# Patient Record
Sex: Female | Born: 1960 | Hispanic: No | Marital: Married | Smoking: Former smoker
Health system: Southern US, Community
[De-identification: ages and names within clinical notes are randomized; demographics above are authoritative.]

## PROBLEM LIST (undated history)

## (undated) DIAGNOSIS — G5730 Lesion of lateral popliteal nerve, unspecified lower limb: Secondary | ICD-10-CM

## (undated) DIAGNOSIS — M5030 Other cervical disc degeneration, unspecified cervical region: Principal | ICD-10-CM

## (undated) DIAGNOSIS — E559 Vitamin D deficiency, unspecified: Secondary | ICD-10-CM

## (undated) DIAGNOSIS — M1990 Unspecified osteoarthritis, unspecified site: Secondary | ICD-10-CM

## (undated) DIAGNOSIS — E890 Postprocedural hypothyroidism: Secondary | ICD-10-CM

## (undated) DIAGNOSIS — M5136 Other intervertebral disc degeneration, lumbar region: Secondary | ICD-10-CM

## (undated) DIAGNOSIS — M4800 Spinal stenosis, site unspecified: Secondary | ICD-10-CM

## (undated) DIAGNOSIS — M419 Scoliosis, unspecified: Secondary | ICD-10-CM

## (undated) DIAGNOSIS — M549 Dorsalgia, unspecified: Secondary | ICD-10-CM

## (undated) HISTORY — DX: Other cervical disc degeneration, unspecified cervical region: M50.30

## (undated) HISTORY — DX: Lesion of lateral popliteal nerve, unspecified lower limb: G57.30

## (undated) HISTORY — PX: CARPAL TUNNEL RELEASE: SHX101

## (undated) HISTORY — PX: THYROIDECTOMY: SHX17

## (undated) HISTORY — PX: LUMBAR DISC SURGERY: SHX700

## (undated) HISTORY — PX: BACK SURGERY: SHX140

## (undated) HISTORY — DX: Vitamin D deficiency, unspecified: E55.9

## (undated) HISTORY — PX: CATARACT EXTRACTION, BILATERAL: SHX1313

## (undated) HISTORY — DX: Postprocedural hypothyroidism: E89.0

## (undated) HISTORY — DX: Other intervertebral disc degeneration, lumbar region: M51.36

---

## 2015-01-29 ENCOUNTER — Encounter (HOSPITAL_COMMUNITY): Payer: Self-pay | Admitting: *Deleted

## 2015-01-29 ENCOUNTER — Emergency Department (INDEPENDENT_AMBULATORY_CARE_PROVIDER_SITE_OTHER)
Admission: EM | Admit: 2015-01-29 | Discharge: 2015-01-29 | Disposition: A | Discharge status: Home / Self Care | Payer: Medicaid Other | Source: Home / Self Care

## 2015-01-29 DIAGNOSIS — M5432 Sciatica, left side: Secondary | ICD-10-CM

## 2015-01-29 DIAGNOSIS — M5126 Other intervertebral disc displacement, lumbar region: Secondary | ICD-10-CM | POA: Diagnosis not present

## 2015-01-29 DIAGNOSIS — G629 Polyneuropathy, unspecified: Secondary | ICD-10-CM

## 2015-01-29 HISTORY — DX: Spinal stenosis, site unspecified: M48.00

## 2015-01-29 HISTORY — DX: Dorsalgia, unspecified: M54.9

## 2015-01-29 HISTORY — DX: Scoliosis, unspecified: M41.9

## 2015-01-29 MED ORDER — METHOCARBAMOL 500 MG PO TABS: 500.0000 mg | ORAL_TABLET | Freq: Four times a day (QID) | ORAL | Status: DC | PRN

## 2015-01-29 MED ORDER — DICLOFENAC SODIUM 75 MG PO TBEC: 75.0000 mg | DELAYED_RELEASE_TABLET | Freq: Two times a day (BID) | ORAL | Status: DC

## 2015-01-29 MED ORDER — PREDNISONE 10 MG PO KIT: PACK | ORAL | Status: DC

## 2015-01-29 NOTE — Discharge Instructions (Signed)
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  Sciatica with Rehab The sciatic nerve runs from the back down the leg and is responsible for sensation and control of the muscles in the back (posterior) side of the thigh, lower leg, and foot. Sciatica is a condition that is characterized by inflammation of this nerve.  SYMPTOMS   Signs of nerve damage, including numbness and/or weakness along the posterior side of the lower extremity.  Pain in the back of the thigh that may also travel down the leg.  Pain that worsens when sitting for long periods of time.  Occasionally, pain in the back or buttock. CAUSES  Inflammation of the sciatic nerve is the cause of sciatica. The inflammation is due to something irritating the nerve. Common sources of irritation include:  Sitting for long periods of time.  Direct trauma to the nerve.  Arthritis of the spine.  Herniated or ruptured disk.  Slipping of the vertebrae (spondylolisthesis).  Pressure from soft tissues, such as muscles or ligament-like tissue (fascia). RISK INCREASES WITH:  Sports that place pressure or stress on the spine (football or weightlifting).  Poor strength and flexibility.  Failure to warm up properly before activity.  Family history of low back pain or disk disorders.  Previous back injury or surgery.  Poor body mechanics, especially when lifting, or poor posture. PREVENTION   Warm up and stretch properly before activity.  Maintain physical fitness:  Strength, flexibility, and endurance.  Cardiovascular fitness.  Learn and use proper technique, especially with posture and lifting. When possible, have coach correct improper  technique.  Avoid activities that place stress on the spine. PROGNOSIS If treated properly, then sciatica usually resolves within 6 weeks. However, occasionally surgery is necessary.  RELATED COMPLICATIONS   Permanent nerve damage, including pain, numbness, tingle, or weakness.  Chronic back pain.  Risks of surgery: infection, bleeding, nerve damage, or damage to surrounding tissues. TREATMENT Treatment initially involves resting from any activities that aggravate your symptoms. The use of ice and medication may help reduce pain and inflammation. The use of strengthening and stretching exercises may help reduce pain with activity. These exercises may be performed at home or with referral to a therapist. A therapist may recommend further treatments, such as transcutaneous electronic nerve stimulation (TENS) or ultrasound. Your caregiver may recommend corticosteroid injections to help reduce inflammation of the sciatic nerve. If symptoms persist despite non-surgical (conservative) treatment, then surgery may be recommended. MEDICATION  If pain medication is necessary, then nonsteroidal anti-inflammatory medications, such as aspirin and ibuprofen, or other minor pain relievers, such as acetaminophen, are often recommended.  Do not take pain medication for 7 days before surgery.  Prescription pain relievers may be given if deemed necessary by your caregiver. Use only as directed and only as much as you need.  Ointments applied to the skin may be helpful.  Corticosteroid injections may be given by your caregiver. These injections should be reserved for the most serious cases, because they may only be given a certain number of times. HEAT AND COLD  Cold treatment (icing) relieves pain and reduces inflammation. Cold treatment should be applied for 10 to 15 minutes every 2 to 3 hours for inflammation and pain and immediately after any  activity that aggravates your symptoms. Use ice packs or  massage the area with a piece of ice (ice massage).  Heat treatment may be used prior to performing the stretching and strengthening activities prescribed by your caregiver, physical therapist, or athletic trainer. Use a heat pack or soak the injury in warm water. SEEK MEDICAL CARE IF:  Treatment seems to offer no benefit, or the condition worsens.  Any medications produce adverse side effects. EXERCISES  RANGE OF MOTION (ROM) AND STRETCHING EXERCISES - Sciatica Most people with sciatic will find that their symptoms worsen with either excessive bending forward (flexion) or arching at the low back (extension). The exercises which will help resolve your symptoms will focus on the opposite motion. Your physician, physical therapist or athletic trainer will help you determine which exercises will be most helpful to resolve your low back pain. Do not complete any exercises without first consulting with your clinician. Discontinue any exercises which worsen your symptoms until you speak to your clinician. If you have pain, numbness or tingling which travels down into your buttocks, leg or foot, the goal of the therapy is for these symptoms to move closer to your back and eventually resolve. Occasionally, these leg symptoms will get better, but your low back pain may worsen; this is typically an indication of progress in your rehabilitation. Be certain to be very alert to any changes in your symptoms and the activities in which you participated in the 24 hours prior to the change. Sharing this information with your clinician will allow him/her to most efficiently treat your condition. These exercises may help you when beginning to rehabilitate your injury. Your symptoms may resolve with or without further involvement from your physician, physical therapist or athletic trainer. While completing these exercises, remember:   Restoring tissue flexibility helps normal motion to return to the joints. This allows  healthier, less painful movement and activity.  An effective stretch should be held for at least 30 seconds.  A stretch should never be painful. You should only feel a gentle lengthening or release in the stretched tissue. FLEXION RANGE OF MOTION AND STRETCHING EXERCISES: STRETCH - Flexion, Single Knee to Chest   Lie on a firm bed or floor with both legs extended in front of you.  Keeping one leg in contact with the floor, bring your opposite knee to your chest. Hold your leg in place by either grabbing behind your thigh or at your knee.  Pull until you feel a gentle stretch in your low back. Hold __________ seconds.  Slowly release your grasp and repeat the exercise with the opposite side. Repeat __________ times. Complete this exercise __________ times per day.  STRETCH - Flexion, Double Knee to Chest  Lie on a firm bed or floor with both legs extended in front of you.  Keeping one leg in contact with the floor, bring your opposite knee to your chest.  Tense your stomach muscles to support your back and then lift your other knee to your chest. Hold your legs in place by either grabbing behind your thighs or at your knees.  Pull both knees toward your chest until you feel a gentle stretch in your low back. Hold __________ seconds.  Tense your stomach muscles and slowly return one leg at a time to the floor. Repeat __________ times. Complete this exercise __________ times per day.  STRETCH - Low Trunk Rotation   Lie on a firm bed or floor. Keeping your legs in front of you, bend  your knees so they are both pointed toward the ceiling and your feet are flat on the floor.  Extend your arms out to the side. This will stabilize your upper body by keeping your shoulders in contact with the floor.  Gently and slowly drop both knees together to one side until you feel a gentle stretch in your low back. Hold for __________ seconds.  Tense your stomach muscles to support your low back as  you bring your knees back to the starting position. Repeat the exercise to the other side. Repeat __________ times. Complete this exercise __________ times per day  EXTENSION RANGE OF MOTION AND FLEXIBILITY EXERCISES: STRETCH - Extension, Prone on Elbows  Lie on your stomach on the floor, a bed will be too soft. Place your palms about shoulder width apart and at the height of your head.  Place your elbows under your shoulders. If this is too painful, stack pillows under your chest.  Allow your body to relax so that your hips drop lower and make contact more completely with the floor.  Hold this position for __________ seconds.  Slowly return to lying flat on the floor. Repeat __________ times. Complete this exercise __________ times per day.  RANGE OF MOTION - Extension, Prone Press Ups  Lie on your stomach on the floor, a bed will be too soft. Place your palms about shoulder width apart and at the height of your head.  Keeping your back as relaxed as possible, slowly straighten your elbows while keeping your hips on the floor. You may adjust the placement of your hands to maximize your comfort. As you gain motion, your hands will come more underneath your shoulders.  Hold this position __________ seconds.  Slowly return to lying flat on the floor. Repeat __________ times. Complete this exercise __________ times per day.  STRENGTHENING EXERCISES - Sciatica  These exercises may help you when beginning to rehabilitate your injury. These exercises should be done near your "sweet spot." This is the neutral, low-back arch, somewhere between fully rounded and fully arched, that is your least painful position. When performed in this safe range of motion, these exercises can be used for people who have either a flexion or extension based injury. These exercises may resolve your symptoms with or without further involvement from your physician, physical therapist or athletic trainer. While completing  these exercises, remember:   Muscles can gain both the endurance and the strength needed for everyday activities through controlled exercises.  Complete these exercises as instructed by your physician, physical therapist or athletic trainer. Progress with the resistance and repetition exercises only as your caregiver advises.  You may experience muscle soreness or fatigue, but the pain or discomfort you are trying to eliminate should never worsen during these exercises. If this pain does worsen, stop and make certain you are following the directions exactly. If the pain is still present after adjustments, discontinue the exercise until you can discuss the trouble with your clinician. STRENGTHENING - Deep Abdominals, Pelvic Tilt   Lie on a firm bed or floor. Keeping your legs in front of you, bend your knees so they are both pointed toward the ceiling and your feet are flat on the floor.  Tense your lower abdominal muscles to press your low back into the floor. This motion will rotate your pelvis so that your tail bone is scooping upwards rather than pointing at your feet or into the floor.  With a gentle tension and even breathing, hold this  position for __________ seconds. Repeat __________ times. Complete this exercise __________ times per day.  STRENGTHENING - Abdominals, Crunches   Lie on a firm bed or floor. Keeping your legs in front of you, bend your knees so they are both pointed toward the ceiling and your feet are flat on the floor. Cross your arms over your chest.  Slightly tip your chin down without bending your neck.  Tense your abdominals and slowly lift your trunk high enough to just clear your shoulder blades. Lifting higher can put excessive stress on the low back and does not further strengthen your abdominal muscles.  Control your return to the starting position. Repeat __________ times. Complete this exercise __________ times per day.  STRENGTHENING - Quadruped, Opposite  UE/LE Lift  Assume a hands and knees position on a firm surface. Keep your hands under your shoulders and your knees under your hips. You may place padding under your knees for comfort.  Find your neutral spine and gently tense your abdominal muscles so that you can maintain this position. Your shoulders and hips should form a rectangle that is parallel with the floor and is not twisted.  Keeping your trunk steady, lift your right hand no higher than your shoulder and then your left leg no higher than your hip. Make sure you are not holding your breath. Hold this position __________ seconds.  Continuing to keep your abdominal muscles tense and your back steady, slowly return to your starting position. Repeat with the opposite arm and leg. Repeat __________ times. Complete this exercise __________ times per day.  STRENGTHENING - Abdominals and Quadriceps, Straight Leg Raise   Lie on a firm bed or floor with both legs extended in front of you.  Keeping one leg in contact with the floor, bend the other knee so that your foot can rest flat on the floor.  Find your neutral spine, and tense your abdominal muscles to maintain your spinal position throughout the exercise.  Slowly lift your straight leg off the floor about 6 inches for a count of 15, making sure to not hold your breath.  Still keeping your neutral spine, slowly lower your leg all the way to the floor. Repeat this exercise with each leg __________ times. Complete this exercise __________ times per day. POSTURE AND BODY MECHANICS CONSIDERATIONS - Sciatica Keeping correct posture when sitting, standing or completing your activities will reduce the stress put on different body tissues, allowing injured tissues a chance to heal and limiting painful experiences. The following are general guidelines for improved posture. Your physician or physical therapist will provide you with any instructions specific to your needs. While reading these  guidelines, remember:  The exercises prescribed by your provider will help you have the flexibility and strength to maintain correct postures.  The correct posture provides the optimal environment for your joints to work. All of your joints have less wear and tear when properly supported by a spine with good posture. This means you will experience a healthier, less painful body.  Correct posture must be practiced with all of your activities, especially prolonged sitting and standing. Correct posture is as important when doing repetitive low-stress activities (typing) as it is when doing a single heavy-load activity (lifting). RESTING POSITIONS Consider which positions are most painful for you when choosing a resting position. If you have pain with flexion-based activities (sitting, bending, stooping, squatting), choose a position that allows you to rest in a less flexed posture. You would want to avoid  curling into a fetal position on your side. If your pain worsens with extension-based activities (prolonged standing, working overhead), avoid resting in an extended position such as sleeping on your stomach. Most people will find more comfort when they rest with their spine in a more neutral position, neither too rounded nor too arched. Lying on a non-sagging bed on your side with a pillow between your knees, or on your back with a pillow under your knees will often provide some relief. Keep in mind, being in any one position for a prolonged period of time, no matter how correct your posture, can still lead to stiffness. PROPER SITTING POSTURE In order to minimize stress and discomfort on your spine, you must sit with correct posture Sitting with good posture should be effortless for a healthy body. Returning to good posture is a gradual process. Many people can work toward this most comfortably by using various supports until they have the flexibility and strength to maintain this posture on their  own. When sitting with proper posture, your ears will fall over your shoulders and your shoulders will fall over your hips. You should use the back of the chair to support your upper back. Your low back will be in a neutral position, just slightly arched. You may place a small pillow or folded towel at the base of your low back for support.  When working at a desk, create an environment that supports good, upright posture. Without extra support, muscles fatigue and lead to excessive strain on joints and other tissues. Keep these recommendations in mind: CHAIR:   A chair should be able to slide under your desk when your back makes contact with the back of the chair. This allows you to work closely.  The chair's height should allow your eyes to be level with the upper part of your monitor and your hands to be slightly lower than your elbows. BODY POSITION  Your feet should make contact with the floor. If this is not possible, use a foot rest.  Keep your ears over your shoulders. This will reduce stress on your neck and low back. INCORRECT SITTING POSTURES   If you are feeling tired and unable to assume a healthy sitting posture, do not slouch or slump. This puts excessive strain on your back tissues, causing more damage and pain. Healthier options include:  Using more support, like a lumbar pillow.  Switching tasks to something that requires you to be upright or walking.  Talking a brief walk.  Lying down to rest in a neutral-spine position. PROLONGED STANDING WHILE SLIGHTLY LEANING FORWARD  When completing a task that requires you to lean forward while standing in one place for a long time, place either foot up on a stationary 2-4 inch high object to help maintain the best posture. When both feet are on the ground, the low back tends to lose its slight inward curve. If this curve flattens (or becomes too large), then the back and your other joints will experience too much stress, fatigue more  quickly and can cause pain.  CORRECT STANDING POSTURES Proper standing posture should be assumed with all daily activities, even if they only take a few moments, like when brushing your teeth. As in sitting, your ears should fall over your shoulders and your shoulders should fall over your hips. You should keep a slight tension in your abdominal muscles to brace your spine. Your tailbone should point down to the ground, not behind your body, resulting in  an over-extended swayback posture.  INCORRECT STANDING POSTURES  Common incorrect standing postures include a forward head, locked knees and/or an excessive swayback. WALKING Walk with an upright posture. Your ears, shoulders and hips should all line-up. PROLONGED ACTIVITY IN A FLEXED POSITION When completing a task that requires you to bend forward at your waist or lean over a low surface, try to find a way to stabilize 3 of 4 of your limbs. You can place a hand or elbow on your thigh or rest a knee on the surface you are reaching across. This will provide you more stability so that your muscles do not fatigue as quickly. By keeping your knees relaxed, or slightly bent, you will also reduce stress across your low back. CORRECT LIFTING TECHNIQUES DO :   Assume a wide stance. This will provide you more stability and the opportunity to get as close as possible to the object which you are lifting.  Tense your abdominals to brace your spine; then bend at the knees and hips. Keeping your back locked in a neutral-spine position, lift using your leg muscles. Lift with your legs, keeping your back straight.  Test the weight of unknown objects before attempting to lift them.  Try to keep your elbows locked down at your sides in order get the best strength from your shoulders when carrying an object.  Always ask for help when lifting heavy or awkward objects. INCORRECT LIFTING TECHNIQUES DO NOT:   Lock your knees when lifting, even if it is a small  object.  Bend and twist. Pivot at your feet or move your feet when needing to change directions.  Assume that you cannot safely pick up a paperclip without proper posture. Document Released: 11/02/2005 Document Revised: 03/19/2014 Document Reviewed: 02/14/2009 [facility] Patient Information 2015 [place], [place]. This information is not intended to replace advice given to you by your health care provider. Make sure you discuss any questions you have with your health care provider.

## 2015-01-29 NOTE — ED Notes (Signed)
Pt is arabic speaking, here with pt and interpreter. Pt complains of back pain with left left limited ROM and numbness. Pt [place]. Pt has only been in [place] X 1 week. Pt is also complaining of left periorbital pressure.

## 2015-01-29 NOTE — ED Provider Notes (Signed)
CSN: [id number]     Arrival date & time 01/29/15  1006 History   None    Chief Complaint  Patient presents with  . Back Pain  . Leg Pain    left  . Numbness    left foot   (Consider location/radiation/quality/duration/timing/severity/associated sxs/prior Treatment) HPI   Assisted in pt encoutner by arabic interpreter.   Patient is a Ashley Bates who arrived from [place] to the [place] one week ago. Patient reports chronic ongoing back pain for several years and is status post 3 surgeries (2007,2008, 2009). Current episode started approximately 2-3 weeks ago. Reports her significant cortisone injection in the back in [place] 2 weeks ago. This provided some temporary relief of the pain but patient noted new onset numbness after the injection. Back pain radiates from the left lower back down the left thigh. Numbness extends down to the left foot and endorses some weakness. Patient is able to ambulate. Nothing makes his symptoms better. Patient is not taken anything for her symptoms. Symptoms are progressive. Denies dizziness, lightheadedness, headache, falls, chest pain, palpitations, seizure-like activity, history of diabetes, rash, fever  Past Medical History  Diagnosis Date  . Back pain   . Scoliosis   . Spinal stenosis    Past Surgical History  Procedure Laterality Date  . Back surgery      x 3  . Lumbar disc surgery     History reviewed. No pertinent family history. History  Substance Use Topics  . Smoking status: Never Smoker   . Smokeless tobacco: Not on file  . Alcohol Use: No   OB History    No data available     Review of Systems  Allergies  Review of patient's allergies indicates no known allergies.  Home Medications   Prior to Admission medications   Medication Sig Start Date End Date Taking? Authorizing Provider  diclofenac (VOLTAREN) 75 MG EC tablet Take 1 tablet (75 mg total) by mouth 2 (two) times daily. 01/29/15   Waldemar Dickens, MD  methocarbamol  (ROBAXIN) 500 MG tablet Take 1-2 tablets (500-1,000 mg total) by mouth every 6 (six) hours as needed for muscle spasms. 01/29/15   Waldemar Dickens, MD  PredniSONE 10 MG KIT 12 day dose pack 01/29/15   Waldemar Dickens, MD   BP 110/68 mmHg  Pulse 119  Temp(Src) 98.7 F (37.1 C) (Oral)  Resp 16  SpO2 96% Physical Exam  Constitutional: She is oriented to person, place, and time. She appears well-developed and well-nourished.  HENT:  Head: Normocephalic and atraumatic.  Eyes: EOM are normal. Pupils are equal, round, and reactive to light.  Neck: Normal range of motion.  Cardiovascular: Normal rate.   Pulmonary/Chest: Effort normal and breath sounds normal.  Abdominal: Soft. Bowel sounds are normal.  Musculoskeletal: Normal range of motion.  L Hip flexion 5/5 and R 5/5. Leg extension L 4/5 and % 5/5.  Abduction and adduction 5/5 bilat  Neurological: She is alert and oriented to person, place, and time.  Skin: Skin is warm.  Psychiatric: She has a normal mood and affect. Her behavior is normal. Judgment and thought content normal.    ED Course  Procedures (including critical care time) Labs Review Labs Reviewed - No data to display  Imaging Review No results found.   MDM   1. Sciatica, left   2. Lumbar herniated disc   3. Neuropathy    Patient with complex past medical history of significant vertebral and spinal pathology. MRI  from [place] with translation included in chart below. Patient with 3 previous spinal surgeries. Current symptoms of sciatica consistent with patient's ongoing chronic medical condition and will require further orthopedic evaluation when patient able. Patient to contact orthopedic office to see when a appointment can be obtained. Of note patient is a refugee here and has been set up for intercurrent coverage and obtaining a primary care physician but the process has not completely conclusion yet. Patient's left leg numbness is in part due to sciatica but also may  be a secondary consultation from a injection that she received 2 weeks ago as the numbness was considerably more noticeable after the injection. Patient given handout for exercises to be performed and given prescriptions for prednisone Dosepak, Voltaren to start after steroids, Robaxin. Patient to go to emergency room if condition significantly worsens.  Precautions given and all questions answered  [name], MD Family Medicine 01/29/2015, 11:42 AM        Waldemar Dickens, MD 01/29/15 [phone]

## 2015-02-25 ENCOUNTER — Encounter (HOSPITAL_COMMUNITY): Payer: Self-pay | Admitting: Emergency Medicine

## 2015-02-25 ENCOUNTER — Emergency Department (HOSPITAL_COMMUNITY)
Admission: EM | Admit: 2015-02-25 | Discharge: 2015-02-25 | Disposition: A | Discharge status: Home / Self Care | Payer: Medicaid Other | Source: Home / Self Care | Attending: Emergency Medicine | Admitting: Emergency Medicine

## 2015-02-25 ENCOUNTER — Emergency Department (INDEPENDENT_AMBULATORY_CARE_PROVIDER_SITE_OTHER): Payer: Medicaid Other

## 2015-02-25 DIAGNOSIS — M1990 Unspecified osteoarthritis, unspecified site: Secondary | ICD-10-CM

## 2015-02-25 DIAGNOSIS — R52 Pain, unspecified: Secondary | ICD-10-CM

## 2015-02-25 LAB — COMPREHENSIVE METABOLIC PANEL
ALT: 14 U/L (ref 0–35)
AST: 15 U/L (ref 0–37)
Albumin: 3.4 g/dL — ABNORMAL LOW (ref 3.5–5.2)
Alkaline Phosphatase: 47 U/L (ref 39–117)
Anion gap: 10 (ref 5–15)
BILIRUBIN TOTAL: 0.7 mg/dL (ref 0.3–1.2)
BUN: 10 mg/dL (ref 6–23)
CHLORIDE: 102 mmol/L (ref 96–112)
CO2: 26 mmol/L (ref 19–32)
CREATININE: 0.62 mg/dL (ref 0.50–1.10)
Calcium: 8.6 mg/dL (ref 8.4–10.5)
GFR calc Af Amer: >90 mL/min (ref >90)
GFR calc non Af Amer: >90 mL/min (ref >90)
Glucose, Bld: 89 mg/dL (ref 70–99)
Potassium: 3.7 mmol/L (ref 3.5–5.1)
Sodium: 138 mmol/L (ref 135–145)
Total Protein: 7.1 g/dL (ref 6.0–8.3)

## 2015-02-25 LAB — CBC WITH DIFFERENTIAL/PLATELET
Basophils Absolute: 0 10*3/uL (ref 0.0–0.1)
Basophils Relative: 0 % (ref 0–1)
EOS PCT: 2 % (ref 0–5)
Eosinophils Absolute: 0.1 10*3/uL (ref 0.0–0.7)
HEMATOCRIT: 33.8 % — AB (ref 36.0–46.0)
HEMOGLOBIN: 10.9 g/dL — AB (ref 12.0–15.0)
LYMPHS PCT: 32 % (ref 12–46)
Lymphs Abs: 1.7 10*3/uL (ref 0.7–4.0)
MCH: 29.8 pg (ref 26.0–34.0)
MCHC: 32.2 g/dL (ref 30.0–36.0)
MCV: 92.3 fL (ref 78.0–100.0)
Monocytes Absolute: 0.4 10*3/uL (ref 0.1–1.0)
Monocytes Relative: 8 % (ref 3–12)
NEUTROS ABS: 3.2 10*3/uL (ref 1.7–7.7)
Neutrophils Relative %: 59 % (ref 43–77)
PLATELETS: 181 10*3/uL (ref 150–400)
RBC: 3.66 MIL/uL — AB (ref 3.87–5.11)
RDW: 14.9 % (ref 11.5–15.5)
WBC: 5.5 10*3/uL (ref 4.0–10.5)

## 2015-02-25 LAB — SEDIMENTATION RATE: Sed Rate: 118 mm/hr — ABNORMAL HIGH (ref 0–22)

## 2015-02-25 MED ORDER — KETOROLAC TROMETHAMINE 60 MG/2ML IM SOLN
INTRAMUSCULAR | Status: AC
  Filled 2015-02-25: qty 2

## 2015-02-25 MED ORDER — MELOXICAM 7.5 MG PO TABS: 7.5000 mg | ORAL_TABLET | Freq: Every day | ORAL | Status: DC

## 2015-02-25 MED ORDER — TRAMADOL HCL 50 MG PO TABS: 50.0000 mg | ORAL_TABLET | Freq: Three times a day (TID) | ORAL | Status: DC | PRN

## 2015-02-25 MED ORDER — KETOROLAC TROMETHAMINE 60 MG/2ML IM SOLN
60.0000 mg | Freq: Once | INTRAMUSCULAR | Status: AC
  Administered 2015-02-25: 60 mg via INTRAMUSCULAR

## 2015-02-25 NOTE — Discharge Instructions (Signed)
Take Meloxicam, 1 pill daily. Take tramadol every 8 hours as needed for severe pain.  Go to the [facility] at [address] Tomorrow at 9am to schedule an appointment.

## 2015-02-25 NOTE — ED Notes (Signed)
C/o back pain and left hand pain

## 2015-02-25 NOTE — ED Provider Notes (Signed)
CSN: [id number]     Arrival date & time 02/25/15  1254 History   First MD Initiated Contact with Patient 02/25/15 1434     Chief Complaint  Patient presents with  . Back Pain  . Hand Pain   (Consider location/radiation/quality/duration/timing/severity/associated sxs/prior Treatment) HPI  She is a 54 year old woman here for evaluation of multiple joint aches. History was obtained with the assistance of an interpreter. Even with interpreter, history is difficult to obtain. She states she has pain and swelling all over her body. Today, it is worse in her wrists, the base of her head, her knees. She reports some swelling in multiple joints. She was seen at this office about a month ago and treated with a course of prednisone and diclofenac for sciatica and she states her swelling has been worse since this. She states she needs assistance to get up from sitting. She has pain with weightbearing. She does report that her hands are stiff in the mornings.  No fevers. She has a significant past medical history for back surgery. She states a doctor told her she had rheumatoid.  Past Medical History  Diagnosis Date  . Back pain   . Scoliosis   . Spinal stenosis    Past Surgical History  Procedure Laterality Date  . Back surgery      x 3  . Lumbar disc surgery     History reviewed. No pertinent family history. History  Substance Use Topics  . Smoking status: Never Smoker   . Smokeless tobacco: Not on file  . Alcohol Use: No   OB History    No data available     Review of Systems  Constitutional: Negative for fever.  Musculoskeletal: Positive for back pain, joint swelling, arthralgias and neck pain.  Neurological: Positive for headaches.    Allergies  Review of patient's allergies indicates no known allergies.  Home Medications   Prior to Admission medications   Medication Sig Start Date End Date Taking? Authorizing Provider  meloxicam (MOBIC) 7.5 MG tablet Take 1 tablet (7.5 mg  total) by mouth daily. 02/25/15   Charm Rings, MD  traMADol (ULTRAM) 50 MG tablet Take 1 tablet (50 mg total) by mouth every 8 (eight) hours as needed for severe pain. 02/25/15   Charm Rings, MD   BP 112/68 mmHg  Pulse 102  Temp(Src) 98.5 F (36.9 C) (Oral)  Resp 20  SpO2 96% Physical Exam  Constitutional: She is oriented to person, place, and time. She appears well-developed and well-nourished. She appears distressed (does appear somewhat uncomfortable).  Pulmonary/Chest: Effort normal.  Musculoskeletal:  Wrist joints are tender to palpation and with passive movement. No obvious swelling in the wrists. She does have some swelling over the MCP joints. Her knees are tender bilaterally. She is able to fully extend them, but with discomfort. No appreciable joint effusion. She does have some swelling in her ankles.  Neurological: She is alert and oriented to person, place, and time.    ED Course  Procedures (including critical care time) Labs Review Labs Reviewed  CBC WITH DIFFERENTIAL/PLATELET - Abnormal; Notable for the following:    RBC 3.66 (*)    Hemoglobin 10.9 (*)    HCT 33.8 (*)    All other components within normal limits  SEDIMENTATION RATE  COMPREHENSIVE METABOLIC PANEL    Imaging Review Dg Knee Complete 4 Views Left  02/25/2015   CLINICAL DATA:  Chronic left knee pain.  No known injury.  EXAM: LEFT KNEE -  COMPLETE 4+ VIEW  COMPARISON:  None.  FINDINGS: No acute bony or joint abnormality is identified. Joint spaces are preserved. Very small joint effusion is noted.  IMPRESSION: Small joint effusion.  Otherwise negative.   Electronically Signed   By: Drusilla Kanner M.D.   On: 02/25/2015 15:16   Dg Hand Complete Left  02/25/2015   CLINICAL DATA:  Bilateral hand pain and knee pain for years. Possible history of rheumatoid arthritis. Language barrier.  EXAM: LEFT HAND - COMPLETE 3+ VIEW  COMPARISON:  None.  FINDINGS: Bone mineral density is normal. No erosions or focal soft  tissue swelling. No acute fracture or subluxation.  IMPRESSION: Negative exam.   Electronically Signed   By: Norva Pavlov M.D.   On: 02/25/2015 15:15   Dg Hand Complete Right  02/25/2015   CLINICAL DATA:  Bilateral hand pain for years. Possible history of rheumatoid arthritis.  EXAM: RIGHT HAND - COMPLETE 3+ VIEW  COMPARISON:  Wound  FINDINGS: Normal bone mineral density. No significant soft tissue swelling. No bone erosions. No acute fracture or subluxation.  IMPRESSION: Negative exam.   Electronically Signed   By: Norva Pavlov M.D.   On: 02/25/2015 15:16     MDM   1. Arthritis   2. Pain    Toradol 60 mg IM given for pain.  Blood work collected for rheumatoid arthritis. She has an appointment in immigrant clinic at [facility] family medicine in 1 week. I've given her prescription for meloxicam 7.5 mg daily. Provided prescription for tramadol to use every 8 hours as needed for severe pain. Discussed that it is very important that she follow up as scheduled.    Charm Rings, MD 02/25/15 [phone]

## 2015-02-28 ENCOUNTER — Emergency Department (HOSPITAL_COMMUNITY)
Admission: EM | Admit: 2015-02-28 | Discharge: 2015-02-28 | Disposition: A | Discharge status: Home / Self Care | Payer: Medicaid Other | Source: Home / Self Care | Attending: Family Medicine | Admitting: Family Medicine

## 2015-02-28 ENCOUNTER — Encounter (HOSPITAL_COMMUNITY): Payer: Self-pay | Admitting: Emergency Medicine

## 2015-02-28 DIAGNOSIS — M069 Rheumatoid arthritis, unspecified: Secondary | ICD-10-CM | POA: Diagnosis not present

## 2015-02-28 DIAGNOSIS — M62830 Muscle spasm of back: Secondary | ICD-10-CM | POA: Diagnosis not present

## 2015-02-28 LAB — RHEUMATOID FACTOR: RHEUMATOID FACTOR: 98.5 [IU]/mL — AB (ref 0.0–13.9)

## 2015-02-28 MED ORDER — METHOCARBAMOL 500 MG PO TABS: 500.0000 mg | ORAL_TABLET | Freq: Four times a day (QID) | ORAL | Status: DC | PRN

## 2015-02-28 MED ORDER — PREDNISONE 10 MG PO KIT: PACK | ORAL | Status: DC

## 2015-02-28 NOTE — ED Provider Notes (Signed)
CSN: [id number]     Arrival date & time 02/28/15  1055 History   First MD Initiated Contact with Patient 02/28/15 1151     Chief Complaint  Patient presents with  . Back Pain  . Leg Pain   (Consider location/radiation/quality/duration/timing/severity/associated sxs/prior Treatment) HPI  Patient encounter aided by interpreter.  Patient seen in urgent care on 02/25/2015 with multiple joint pains treated with Toradol injection and given a prescription for tramadol and meloxicam. Patient reports little to no relief since being seen. Patient still complaining of wrist and hand pain primarily but also with the and back pain. Pain is chronic and achy and getting worse. Denies effusions. This is been a ongoing issue off and on for several years. Patient reports a previous diagnoses of rheumatoid arthritis but has had no further treatment. Patient denies fevers, generalized body aches, nausea, vomiting, chest pain, palpitations, shortness of breath, headache, LOC, rash.  She also complaining of acute onset mid to lower back pain. Right greater than left. Worse with certain movements. Denies numbness or tingling down the buttocks or legs. Denies radiation of pain.  Past Medical History  Diagnosis Date  . Back pain   . Scoliosis   . Spinal stenosis    Past Surgical History  Procedure Laterality Date  . Back surgery      x 3  . Lumbar disc surgery     Family History  Problem Relation Age of Onset  . Diabetes Neg Hx   . Hyperlipidemia Neg Hx   . Hypertension Neg Hx   . Heart failure Neg Hx    History  Substance Use Topics  . Smoking status: Never Smoker   . Smokeless tobacco: Not on file  . Alcohol Use: No   OB History    No data available     Review of Systems Per HPI with all other pertinent systems negative.   Allergies  Review of patient's allergies indicates no known allergies.  Home Medications   Prior to Admission medications   Medication Sig Start Date End Date  Taking? Authorizing Provider  meloxicam (MOBIC) 7.5 MG tablet Take 1 tablet (7.5 mg total) by mouth daily. 02/25/15  Yes Melony Overly, MD  traMADol (ULTRAM) 50 MG tablet Take 1 tablet (50 mg total) by mouth every 8 (eight) hours as needed for severe pain. 02/25/15  Yes Melony Overly, MD  methocarbamol (ROBAXIN) 500 MG tablet Take 1-2 tablets (500-1,000 mg total) by mouth every 6 (six) hours as needed for muscle spasms. 02/28/15   Waldemar Dickens, MD  PredniSONE 10 MG KIT 12 day dose pack 02/28/15   Waldemar Dickens, MD   BP 94/70 mmHg  Pulse 98  Temp(Src) 97.9 F (36.6 C) (Oral)  Resp 16  SpO2 95% Physical Exam Physical Exam  Constitutional: oriented to person, place, and time. appears well-developed and well-nourished. No distress.  HENT:  Head: Normocephalic and atraumatic.  Eyes: EOMI. PERRL.  Neck: Normal range of motion.  Cardiovascular: RRR, no m/r/g, 2+ distal pulses,  Pulmonary/Chest: Effort normal and breath sounds normal. No respiratory distress.  Abdominal: Soft. Bowel sounds are normal. NonTTP, no distension.  Musculoskeletal: Normal range of motion. Non ttp, no effusion.  Neurological: alert and oriented to person, place, and time.  Skin: Skin is warm. No rash noted. non diaphoretic.  Psychiatric: normal mood and affect. behavior is normal. Judgment and thought content normal.    ED Course  Procedures (including critical care time) Labs Review Labs Reviewed -  No data to display  Imaging Review No results found.   MDM   1. Rheumatoid arthritis   2. Back spasm    Reviewed labs with patient via interpreter. Consistent with rheumatoid arthritis. Patient will need rheumatologic evaluation and management through PCPs office or through specialist. Start patient on prednisone Dosepak, and Robaxin for muscle spasms. Of note patient is set to establish care through the most current family practice center immigrate clinic next week. Patient aware of this appointment and will keep  this appointment.    Waldemar Dickens, MD 02/28/15 [phone]

## 2015-02-28 NOTE — Discharge Instructions (Signed)
You have Rheumatoid arthritis You will need to see a specialist for this Please start the steroids Please use the robaxin for muscle spasms in your back  Please make sure to keep you appointment at the [facility].

## 2015-02-28 NOTE — ED Notes (Signed)
Reports being seen three days ago for the same problem.  States after a couple days felt a little better but last night pain became severe.

## 2015-03-04 ENCOUNTER — Encounter: Payer: Self-pay | Admitting: Family Medicine

## 2015-03-04 ENCOUNTER — Ambulatory Visit (INDEPENDENT_AMBULATORY_CARE_PROVIDER_SITE_OTHER): Payer: Medicaid Other | Admitting: Family Medicine

## 2015-03-04 VITALS — BP 122/70 | HR 62 | Temp 98.1°F | Ht 65.25 in | Wt 160.3 lb

## 2015-03-04 DIAGNOSIS — Z008 Encounter for other general examination: Secondary | ICD-10-CM

## 2015-03-04 DIAGNOSIS — M545 Low back pain: Secondary | ICD-10-CM

## 2015-03-04 DIAGNOSIS — M21372 Foot drop, left foot: Secondary | ICD-10-CM

## 2015-03-04 DIAGNOSIS — M069 Rheumatoid arthritis, unspecified: Secondary | ICD-10-CM | POA: Diagnosis not present

## 2015-03-04 DIAGNOSIS — H409 Unspecified glaucoma: Secondary | ICD-10-CM

## 2015-03-04 DIAGNOSIS — Z0289 Encounter for other administrative examinations: Secondary | ICD-10-CM

## 2015-03-04 NOTE — Progress Notes (Signed)
Ashley Bates - Arabic interpreter utilized during today's visit.  Immigrant Clinic New Patient Visit  HPI:  Patient presents to [facility] today for a new patient appointment to establish general primary care, also to discuss several issues.  She has a history of lumbago for years and what sounds like spinal stenosis, for which she's been treated with lumbar laminectomy x 3.  Also history of "bulging disc" in her cervical spine.    She arrived in the [place] in early March.  Had noted sudden Left drop foot about a week before coming to the [place].  Has paresthesias in Left leg that extend from her buttocks across her knee to her foot.  Otherwise good sensation there. Seen on 3/15 at [facility] for back pain.  Given Prednisone, Diclofenac, and Methocarbomal at that time.  Better, but no longer taking those medications.  Still some back pain, but better than when she presented to UC.  Recently, she's been seen at Urgent Care on 4/11 after mechanical fall.  She had several x-rays performed on her hands and knees, but these were negative and she is doing much better today without further symptoms.  Back pain at that time as well.    She denies any night sweats, fevers, chills, hemoptysis.  No urinary or bowel incontinence.  No saddle anesthesia.   Brings 8 pill bottles of Diclofenac, tramadol, meloxicam, prednisone plus pred-pak.  She is confused over what to take when.    Glaucoma:  Also told that she had "high pressure" in her eyes but never given any medications for this.  No current eye pain.  Inconsistent headaches for years, mostly triggered by stress, frontal in nature, no accompanying symptoms.  Vision good today and has been since arrival in [place].   Also describes fairly diffuse pains, worse mostly in her hands (left > right), Left shoulder, lower back, and Left foot.     ROS: See HPI  Immigrant Social History: - Date arrived in [place]: 01/18/14 - Country of origin: [place] - Location of refugee camp (if applicable),  how long there, and what caused patient to leave home country?: No camp, was able to flee country straight to [place] - Primary language: Arabic  -Requires intepreter (essentially speaks no [place]) - Tobacco/alcohol/drug use: denies - Marriage Status: Married.  With 1 son  Preventative Care History: -Seen at health department?: yes, awaiting records.   Past Medical Hx:  - 3 admissions for herniated disc  - known herniated disc in neck as well.    Past Surgical Hx:  - laminectomy x 3 while in [place]. - surgical thyroidectomy/nodule removal.   - s/p carpal tunnel surgery   Family Hx: updated in Epic - Number of family members:  Denies family history.  Husband and son with her in [place].  Rest of family remains in [place] and awaiting refugee status, attempting to make it to refugee camp   PE: BP 122/70 mmHg  Pulse 62  Temp(Src) 98.1 F (36.7 C) (Oral)  Ht 5' 5.25" (1.657 m)  Wt 160 lb 4.8 oz (72.712 kg)  BMI 26.48 kg/m2 Gen: Well NAD HEENT:  [place]/AT.  EOMI, PERRL. No redness of conjunctiva BL.  Cataracts noted BL.  MMM, tonsils non-erythematous, non-edematous.  External ears WNL, Bilateral TM's normal without retraction, redness or bulging.  Neck:  Supple, able to move in all directions, though somewhat limited lateral movement due to stiffness.  No pain when palpating cervical spine.   Lungs: CTABL Nl WOB Heart: RRR.  DP  pulses +1 BL  Abd: NABS, NT, ND.  No masses noted.  Exts: Non edematous BL  LE, warm and well perfused.  Skin:  No bruising or lesions noted.  MSK;  Minimal BL lumbar back tenderness.  Midline surgical scars noted.  No tenderness directly over spine.  Decreased forward flexion due to stiffness.  SLR positive for back pain BL.  Right foot and leg WNL.  Left foot with 4/5 plantar flexion, 2/5 dorsiflexion.  Neuro:  Sensation intact BL feet, to gross and light touch.    Case Worker CWSBoykin Nearing [phone] (present today at visit)

## 2015-03-04 NOTE — Patient Instructions (Signed)
We will call you tomorrow with an appointment for the MRI for your back.  After that, we will get you an appointment with an Ophthalmologist for your eyes and a Rheumatologist for your arthritis.   And you need to come back here for a lab appointment.    It was good to see you today.  Come back and see me in 2 weeks.

## 2015-03-04 NOTE — ED Notes (Signed)
Sed rate and Rheumatoid factor elevated.  4/13 Message sent to Dr. Piedad Climes.  4/16.  She wrote that Dr. Konrad Dolores went over the results with her a few days ago and is aware that she needs to establish with [facility] next week. Vassie Moselle 03/04/2015

## 2015-03-07 DIAGNOSIS — M545 Low back pain, unspecified: Secondary | ICD-10-CM | POA: Insufficient documentation

## 2015-03-07 DIAGNOSIS — H409 Unspecified glaucoma: Secondary | ICD-10-CM | POA: Insufficient documentation

## 2015-03-07 DIAGNOSIS — M21372 Foot drop, left foot: Secondary | ICD-10-CM | POA: Insufficient documentation

## 2015-03-07 DIAGNOSIS — Z0289 Encounter for other administrative examinations: Secondary | ICD-10-CM | POA: Insufficient documentation

## 2015-03-07 NOTE — Assessment & Plan Note (Signed)
Relatively new onset.  See lumbago for prednisone dosing Urgent MRI

## 2015-03-07 NOTE — Assessment & Plan Note (Signed)
Doesn't sound like she was ever treated for this. No red flags today.  Referral for ophtho today.

## 2015-03-07 NOTE — Assessment & Plan Note (Signed)
With relatively (~6 weeks) new Left foot drop. No other red flags. Will obtain urgent MRI and possible neurosurgery referral. Stopped almost all of her meds EXCEPT Tramadol for as needed pain relief and Prednisone. She had only taken four 10 mg pills of her Prednisone based on what was punched out of the dose pak.  States she did not take these all at once.   Started new prednisone dosing based on what she has left:  60 mg x 2 days, 40 mg x 3 days, 20 mg x 5 days.  FU shortly - 1-2 weeks.  Will contact her case worker Nidahl who speaks Arabic to relay MRI appointment.  Plan will be based on results.

## 2015-03-07 NOTE — Assessment & Plan Note (Addendum)
New diagnosis - -based on labwork here in [place] Needs referral to Rheumatology for further workup   Awaiting rest of labwork from [facility].  Stopped diclofenac and meloxicam over concern for concurrent use and renal damage.  States she hasn't taken either of these medicines for at least several days if not longer.

## 2015-03-08 ENCOUNTER — Telehealth: Payer: Self-pay | Admitting: Family Medicine

## 2015-03-08 DIAGNOSIS — M48061 Spinal stenosis, lumbar region without neurogenic claudication: Secondary | ICD-10-CM

## 2015-03-08 NOTE — Telephone Encounter (Signed)
Called Nidahl patient's case worker to discuss that patient had an appointment scheduled at [facility] this coming Monday 4/25 at 10:45 AM for an MRI.    Left message to return our call.

## 2015-03-11 ENCOUNTER — Other Ambulatory Visit: Payer: Medicaid Other

## 2015-03-11 NOTE — Telephone Encounter (Signed)
[facility] Ward has contacted me -- they will be unable to get the patient to her appointment on time.  White Team -- can you call [facility] at [phone] to cancel Ashley Bates's appoitnment today at 3 pm?  Will you then try to schedule an Urgent (aka ASAP) MRI for anytime later this week?   Let me know when that will appointment will be, I'll contact [facility].  Thanks!  JW

## 2015-03-11 NOTE — Telephone Encounter (Signed)
Called and spoke with Nidahl Case Production designer, theatre/television/film.  Despite what she told me at our appt last Monday -- "please call me with the appt time" -- she told me today that I needed to call [facility] with [employer].  I have called and changed the patient's appt to 3 pm today and emailed [facility] all the significant details (I don't have her number and she's been very responsive to email in the past).    Recommended if that time doesn't work to reschedule or let us know and we could try for MRI at another imaging center rather than [facility].

## 2015-03-11 NOTE — Telephone Encounter (Signed)
Appointment set up for Tuesday 5/3 @ 1pm, patient needs to arrive at 12:45pm. [facility] hospital radiology 1st floor

## 2015-03-12 NOTE — Telephone Encounter (Signed)
Thank you for your help setting this up.  I have contacted Phylliss Bob, the patient's case worker to make sure she arrives her appointment.

## 2015-03-18 ENCOUNTER — Ambulatory Visit (HOSPITAL_COMMUNITY): Payer: Medicaid Other

## 2015-03-19 ENCOUNTER — Telehealth: Payer: Self-pay | Admitting: *Deleted

## 2015-03-19 ENCOUNTER — Ambulatory Visit (HOSPITAL_COMMUNITY)
Admission: RE | Admit: 2015-03-19 | Discharge: 2015-03-19 | Disposition: A | Discharge status: Home / Self Care | Payer: Medicaid Other | Source: Ambulatory Visit | Attending: Family Medicine | Admitting: Family Medicine

## 2015-03-19 DIAGNOSIS — M4806 Spinal stenosis, lumbar region: Secondary | ICD-10-CM | POA: Insufficient documentation

## 2015-03-19 DIAGNOSIS — M5126 Other intervertebral disc displacement, lumbar region: Secondary | ICD-10-CM | POA: Insufficient documentation

## 2015-03-19 DIAGNOSIS — M21372 Foot drop, left foot: Secondary | ICD-10-CM | POA: Insufficient documentation

## 2015-03-19 DIAGNOSIS — M5440 Lumbago with sciatica, unspecified side: Secondary | ICD-10-CM | POA: Diagnosis present

## 2015-03-19 DIAGNOSIS — M545 Low back pain: Secondary | ICD-10-CM

## 2015-03-19 NOTE — Telephone Encounter (Signed)
Received call report for [facility] regarding patient's MRI done today. Preceptor informed. Will forward to PCP and ordering MD.

## 2015-03-20 ENCOUNTER — Emergency Department (HOSPITAL_COMMUNITY)
Admission: EM | Admit: 2015-03-20 | Discharge: 2015-03-21 | Disposition: A | Discharge status: Home / Self Care | Payer: Medicaid Other | Attending: Emergency Medicine | Admitting: Emergency Medicine

## 2015-03-20 ENCOUNTER — Encounter (HOSPITAL_COMMUNITY): Payer: Self-pay | Admitting: Emergency Medicine

## 2015-03-20 DIAGNOSIS — Z7952 Long term (current) use of systemic steroids: Secondary | ICD-10-CM | POA: Diagnosis not present

## 2015-03-20 DIAGNOSIS — M2550 Pain in unspecified joint: Secondary | ICD-10-CM | POA: Diagnosis not present

## 2015-03-20 DIAGNOSIS — Z791 Long term (current) use of non-steroidal anti-inflammatories (NSAID): Secondary | ICD-10-CM | POA: Insufficient documentation

## 2015-03-20 DIAGNOSIS — M1990 Unspecified osteoarthritis, unspecified site: Secondary | ICD-10-CM | POA: Insufficient documentation

## 2015-03-20 DIAGNOSIS — Z8639 Personal history of other endocrine, nutritional and metabolic disease: Secondary | ICD-10-CM | POA: Insufficient documentation

## 2015-03-20 MED ORDER — KETOROLAC TROMETHAMINE 30 MG/ML IJ SOLN
30.0000 mg | Freq: Once | INTRAMUSCULAR | Status: AC
  Administered 2015-03-20: 30 mg via INTRAMUSCULAR
  Filled 2015-03-20: qty 1

## 2015-03-20 NOTE — ED Notes (Signed)
Bed: LD35 Expected date:  Expected time:  Means of arrival:  Comments: EMS 3F Joint Pain, fever

## 2015-03-20 NOTE — ED Notes (Signed)
Pt taken off Arthritis medication 3 days ago and received multiple immunizations on Friday. Pt family at bedside. Pt experiencing intense generalized joint pain with movement.

## 2015-03-20 NOTE — ED Provider Notes (Signed)
CSN: [id number]     Arrival date & time 03/20/15  2116 History   First MD Initiated Contact with Patient 03/20/15 2224     Chief Complaint  Patient presents with  . Arthritis     (Consider location/radiation/quality/duration/timing/severity/associated sxs/prior Treatment) HPI Comments: Patient with a history of RA presents today with complaints of joint pain.  She reports that her joints all over her body feel painful.  She reports that she was on Prednisone, Diclofenac, and Tramadol for the pain.  The medications were working well until she ran out of the medication one week ago.  She reports that she has an appointment with her PCP tomorrow.  She has not taken anything for the pain today.  She reports that the pain feels similar to the pain that she has had in the past.  She denies fever, chills, nausea, vomiting, swelling/erythema of the joints.    Patient is a 54 y.o. female presenting with arthritis. The history is provided by the patient. The history is limited by a language barrier. A language interpreter was used (arabic phone interpretor used).  Arthritis    Past Medical History  Diagnosis Date  . Back pain   . Scoliosis   . Spinal stenosis   . Post-surgical hypothyroidism    Past Surgical History  Procedure Laterality Date  . Back surgery      x 3 (last in 2009)  . Lumbar disc surgery    . Carpal tunnel release      2007  . Thyroidectomy      2006   Family History  Problem Relation Age of Onset  . Diabetes Neg Hx   . Hyperlipidemia Neg Hx   . Hypertension Neg Hx   . Heart failure Neg Hx    History  Substance Use Topics  . Smoking status: Never Smoker   . Smokeless tobacco: Not on file  . Alcohol Use: No   OB History    No data available     Review of Systems  Musculoskeletal: Positive for arthritis.  All other systems reviewed and are negative.     Allergies  Review of patient's allergies indicates no known allergies.  Home Medications   Prior  to Admission medications   Medication Sig Start Date End Date Taking? Authorizing Provider  diclofenac (VOLTAREN) 75 MG EC tablet Take 75 mg by mouth 2 (two) times daily.   Yes Historical Provider, MD  diphenhydrAMINE (BENADRYL) 25 MG tablet Take 25 mg by mouth every 6 (six) hours as needed for allergies (allergies).   Yes Historical Provider, MD  ibuprofen (ADVIL,MOTRIN) 200 MG tablet Take 200 mg by mouth every 6 (six) hours as needed for moderate pain (pain).   Yes Historical Provider, MD  PredniSONE 10 MG KIT 12 day dose pack 02/28/15  Yes Waldemar Dickens, MD  traMADol (ULTRAM) 50 MG tablet Take 1 tablet (50 mg total) by mouth every 8 (eight) hours as needed for severe pain. 02/25/15  Yes Melony Overly, MD  meloxicam (MOBIC) 7.5 MG tablet Take 1 tablet (7.5 mg total) by mouth daily. Patient not taking: Reported on 03/20/2015 02/25/15   Melony Overly, MD  methocarbamol (ROBAXIN) 500 MG tablet Take 1-2 tablets (500-1,000 mg total) by mouth every 6 (six) hours as needed for muscle spasms. Patient not taking: Reported on 03/20/2015 02/28/15   Waldemar Dickens, MD   BP 125/69 mmHg  Pulse 95  Temp(Src) 98.1 F (36.7 C) (Oral)  Resp 16  SpO2  98% Physical Exam  Constitutional: She appears well-developed and well-nourished.  HENT:  Head: Normocephalic and atraumatic.  Mouth/Throat: Oropharynx is clear and moist.  Neck: Normal range of motion. Neck supple.  Cardiovascular: Normal rate, regular rhythm and normal heart sounds.   Pulmonary/Chest: Effort normal and breath sounds normal.  Musculoskeletal:  Full ROM of all extremities No erythema, warmth, or swelling of the joints.   Neurological: She is alert.  Skin: Skin is warm and dry.  Psychiatric: She has a normal mood and affect.  Nursing note and vitals reviewed.   ED Course  Procedures (including critical care time) Labs Review Labs Reviewed - No data to display  Imaging Review Mr Lumbar Spine Wo Contrast  03/19/2015   CLINICAL DATA:  Low  back pain with sciatica.  Left foot drop 6 weeks  EXAM: MRI LUMBAR SPINE WITHOUT CONTRAST  TECHNIQUE: Multiplanar, multisequence MR imaging of the lumbar spine was performed. No intravenous contrast was administered.  COMPARISON:  None.  FINDINGS: Normal lumbar alignment with mild kyphosis. Negative for fracture or mass lesion. Conus medullaris is normal and terminates at L1-2.  L1-2: Broad-based central disc protrusion. Mild facet degeneration causing moderate spinal stenosis. Neural foramina patent  L2-3: Moderately large central and left-sided disc protrusion compressing the thecal sac and causing moderate to severe spinal stenosis. Mild facet and ligamentum flavum hypertrophy. Subarticular recess stenosis on the left could cause impingement of the left L3 nerve root. Neural foramina adequately patent  L3-4: Diffuse disc bulging and mild spurring. Mild facet and ligamentum flavum hypertrophy causing moderate spinal stenosis and moderate subarticular stenosis bilaterally left greater than right  L4-5: Moderate disc degeneration and spondylosis. Small right paracentral disc protrusion indenting the thecal sac. Possible impingement of the L5 nerve root in the subarticular recess bilaterally right greater than left. Mild spinal stenosis and mild facet degeneration  L5-S1:  Negative  IMPRESSION: Moderate spinal stenosis L1-2 with a central disc protrusion  Central and left-sided disc protrusion L2-3 with moderate to severe spinal stenosis and subarticular recess stenosis on the left.  Moderate spinal stenosis L3-4 with moderate subarticular stenosis bilaterally left greater than right  Mild spinal stenosis L4-5 with small right-sided disc protrusion and possible impingement of the right L5 nerve root.  These results will be called to the ordering clinician or representative by the Radiologist Assistant, and communication documented in the PACS or zVision Dashboard.   Electronically Signed   By: Franchot Gallo M.D.    On: 03/19/2015 14:34     EKG Interpretation None      MDM   Final diagnoses:  None   Patient with a history of RA presents today with diffuse joint pain.  She reports that the pain feels similar to pain that she has had in the past.  She states that she ran out of her medications one week ago, which made the pain worse.  No signs of septic joints.  Patient is afebrile.  Pain significantly improved after Toradol.  Feel that the patient is stable for discharge.  Patient has a PCP appointment tomorrow.  Return precautions given.    Hyman Bible, PA-C 03/21/15 [place], MD 03/22/15 [phone]

## 2015-03-21 ENCOUNTER — Ambulatory Visit (INDEPENDENT_AMBULATORY_CARE_PROVIDER_SITE_OTHER): Payer: Medicaid Other | Admitting: Family Medicine

## 2015-03-21 ENCOUNTER — Encounter: Payer: Self-pay | Admitting: Family Medicine

## 2015-03-21 VITALS — BP 92/59 | HR 106 | Temp 98.3°F | Ht 65.0 in | Wt 156.9 lb

## 2015-03-21 DIAGNOSIS — M4806 Spinal stenosis, lumbar region: Secondary | ICD-10-CM | POA: Diagnosis present

## 2015-03-21 DIAGNOSIS — M069 Rheumatoid arthritis, unspecified: Secondary | ICD-10-CM

## 2015-03-21 DIAGNOSIS — H409 Unspecified glaucoma: Secondary | ICD-10-CM

## 2015-03-21 DIAGNOSIS — M48061 Spinal stenosis, lumbar region without neurogenic claudication: Secondary | ICD-10-CM

## 2015-03-21 MED ORDER — TRAMADOL HCL 50 MG PO TABS: 50.0000 mg | ORAL_TABLET | Freq: Four times a day (QID) | ORAL | Status: DC | PRN

## 2015-03-21 MED ORDER — NAPROXEN 500 MG PO TABS: 500.0000 mg | ORAL_TABLET | Freq: Two times a day (BID) | ORAL | Status: DC

## 2015-03-21 MED ORDER — PREDNISONE 50 MG PO TABS: 50.0000 mg | ORAL_TABLET | Freq: Every day | ORAL | Status: DC

## 2015-03-21 MED ORDER — NAPROXEN 500 MG PO TABS
500.0000 mg | ORAL_TABLET | Freq: Two times a day (BID) | ORAL | Status: DC
Start: 2015-03-21 — End: 2015-04-09

## 2015-03-21 NOTE — Patient Instructions (Signed)
Spinal Stenosis Spinal stenosis is an abnormal narrowing of the canals of your spine (vertebrae). CAUSES  Spinal stenosis is caused by areas of bone pushing into the central canals of your vertebrae. This condition can be present at birth (congenital). It also may be caused by arthritic deterioration of your vertebrae (spinal degeneration).  SYMPTOMS   Pain that is generally worse with activities, particularly standing and walking.  Numbness, tingling, hot or cold sensations, weakness, or weariness in your legs.  Frequent episodes of falling.  A foot-slapping gait that leads to muscle weakness. DIAGNOSIS  Spinal stenosis is diagnosed with the use of magnetic resonance imaging (MRI) or computed tomography (CT). TREATMENT  Initial therapy for spinal stenosis focuses on the management of the pain and other symptoms associated with the condition. These therapies include:  Practicing postural changes to lessen pressure on your nerves.  Exercises to strengthen the core of your body.  Loss of excess body weight.  The use of nonsteroidal anti-inflammatory medicines to reduce swelling and inflammation in your nerves. When therapies to manage pain are not successful, surgery to treat spinal stenosis may be recommended. This surgery involves removing excess bone, which puts pressure on your nerve roots. During this surgery (laminectomy), the posterior boney arch (lamina) and excess bone around the facet joints are removed. Document Released: 01/23/2004 Document Revised: 03/19/2014 Document Reviewed: 02/10/2013 ExitCare Patient Information 2015 [employer]. This information is not intended to replace advice given to you by your health care provider. Make sure you discuss any questions you have with your health care provider.  

## 2015-03-21 NOTE — Telephone Encounter (Signed)
Just received report about patient's MRI.  I have put in an urgent referral to Neurosurgery for consultation and will speak with the [facility] Team nurses to expedite this.  Will contact [employer] to ensure they can schedule this.

## 2015-03-21 NOTE — Addendum Note (Signed)
Addended by: Tobey Grim on: 03/21/2015 09:31 AM   Modules accepted: Orders

## 2015-03-21 NOTE — Assessment & Plan Note (Signed)
MRI done for back pain and foot drop, stenosis at multiple levels with several protruding disks and some nerve root impingement - urgent neurosurgery referral

## 2015-03-21 NOTE — Assessment & Plan Note (Addendum)
Severe pain and swelling in hands, wrists, knees and ankles. Subjective fever yesterday. - prednisone burst for RA flair, 10 days (patient reports this was most helpful last time but counseled that this is not a good chronic medication) - refilled tramadol - switch NSAID from voltaren to naprosyn (patient reporting headaches and worsened back pain on voltaren) - refer to rheumatology for possible DMARD start - f/u in 1 month

## 2015-03-21 NOTE — Progress Notes (Signed)
   Subjective:    Patient ID: Ashley Bates, female    DOB: 1960/11/29, 54 y.o.   MRN: [id number]  HPI Pt presents for f/u of joint pain and swelling. Reports that the tramadol and prednisone she was given last time were very helpful but the voltaren made her head hurt and her back pain worse. 2 days ago her pain became very severe and her joint began to swell. She has noticed this most in her hands and wrists but also has a lot of pain in her knees and ankles. She went to the ED for this yesterday and also had a subjective fever at that time but temp recorded by ED was 98.1 and afebrile today. The ED gave her toradol which helped and sent her with a few tramadol and recommendations to follow-up with [place].   Review of Systems See HPI    Objective:   Physical Exam  Constitutional: She is oriented to person, place, and time. She appears well-developed and well-nourished. She appears distressed.  HENT:  Head: Normocephalic and atraumatic.  Eyes: Conjunctivae are normal. Right eye exhibits no discharge. Left eye exhibits no discharge.  Cardiovascular: Normal rate.   Pulmonary/Chest: Effort normal. No respiratory distress.  Abdominal: She exhibits no distension.  Musculoskeletal: She exhibits edema and tenderness.       Right shoulder: She exhibits decreased range of motion, tenderness and pain. She exhibits no swelling, no deformity and no spasm.       Left shoulder: She exhibits decreased range of motion, tenderness and pain. She exhibits no swelling, no deformity and no spasm.       Right wrist: She exhibits decreased range of motion, tenderness, bony tenderness and swelling. She exhibits no deformity and no laceration.       Left wrist: She exhibits decreased range of motion, tenderness, bony tenderness and swelling. She exhibits no deformity and no laceration.       Right knee: She exhibits swelling. She exhibits normal range of motion and no erythema.       Left knee: She exhibits  swelling. She exhibits normal range of motion and no erythema.       Right ankle: She exhibits swelling. She exhibits normal range of motion.       Left ankle: She exhibits swelling. She exhibits normal range of motion.       Right hand: She exhibits decreased range of motion, tenderness, bony tenderness and swelling. She exhibits no deformity. Normal sensation noted. Normal strength noted.       Left hand: She exhibits decreased range of motion, tenderness, bony tenderness and swelling. She exhibits no deformity and no laceration. Normal sensation noted. Normal strength noted.  Some warmth of bilateral ankles, swelling and tenderness of wrists, MCP, PIP and DIP joints. Pain and tenderness of medial knees  Neurological: She is alert and oriented to person, place, and time.  Skin: Skin is warm and dry. She is not diaphoretic.  Psychiatric: She has a normal mood and affect. Her behavior is normal.  Nursing note and vitals reviewed.         Assessment & Plan:

## 2015-03-21 NOTE — Assessment & Plan Note (Signed)
Reports history of glaucoma and cataracts, untreated. No acute changes today - refer to opthalmology

## 2015-03-25 ENCOUNTER — Encounter: Payer: Self-pay | Admitting: Neurology

## 2015-03-25 ENCOUNTER — Ambulatory Visit (INDEPENDENT_AMBULATORY_CARE_PROVIDER_SITE_OTHER): Payer: Medicaid Other | Admitting: Neurology

## 2015-03-25 ENCOUNTER — Ambulatory Visit (INDEPENDENT_AMBULATORY_CARE_PROVIDER_SITE_OTHER): Payer: Self-pay | Admitting: Neurology

## 2015-03-25 DIAGNOSIS — G5732 Lesion of lateral popliteal nerve, left lower limb: Secondary | ICD-10-CM | POA: Diagnosis not present

## 2015-03-25 DIAGNOSIS — M21372 Foot drop, left foot: Secondary | ICD-10-CM | POA: Diagnosis not present

## 2015-03-25 NOTE — Progress Notes (Signed)
Please refer to EMG and NCV procedure noted.

## 2015-03-25 NOTE — Procedures (Signed)
     HISTORY:  Ashley Bates is a 54 year old [place] female with a history of a footdrop that began in February 2016. The patient reports some discomfort in the lateral aspect of her leg below the knee. She does report some low back pain, but she has had 3 prior lumbosacral spine surgeries, the last surgery was about 9 years ago. The patient is being evaluated for the footdrop.  NERVE CONDUCTION STUDIES:  Nerve conduction studies were performed on both lower extremities. The distal motor latencies for the peroneal and posterior tibial nerves were normal bilaterally. The motor amplitudes for the peroneal nerves were borderline normal on the right, low on the left, and normal for the posterior tibial nerves bilaterally. The H reflex latencies were symmetric and normal, and the peroneal sensory latencies were normal on the right, absent on the left.  EMG STUDIES:  EMG study was performed on the left lower extremity:  The tibialis anterior muscle reveals no voluntary motor units with no recruitment. 3+ fibrillations and positive waves were seen. The peroneus tertius muscle reveals no voluntary motor units with no recruitment. 3+ fibrillations and positive waves were seen. The medial gastrocnemius muscle reveals 1 to 3K motor units with full recruitment. No fibrillations or positive waves were seen. The vastus lateralis muscle reveals 2 to 4K motor units with full recruitment. No fibrillations or positive waves were seen. The iliopsoas muscle reveals 2 to 4K motor units with full recruitment. No fibrillations or positive waves were seen. The biceps femoris muscle (long head) reveals 2 to 4K motor units with full recruitment. No fibrillations or positive waves were seen. The lumbosacral paraspinal muscles were tested at 3 levels, and revealed no abnormalities of insertional activity at all 3 levels tested. There was good relaxation.   IMPRESSION:  Nerve conduction studies done on both lower  extremities shows evidence of left peroneal nerve dysfunction. 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 There is no evidence of an overlying lumbosacral radiculopathy.  Marlan Palau MD 03/25/2015 2:01 PM  [facility] [address] [place], [place] [address]  Phone [phone] Fax [phone]

## 2015-04-08 DIAGNOSIS — Z87891 Personal history of nicotine dependence: Secondary | ICD-10-CM | POA: Diagnosis not present

## 2015-04-08 DIAGNOSIS — Z7952 Long term (current) use of systemic steroids: Secondary | ICD-10-CM | POA: Insufficient documentation

## 2015-04-08 DIAGNOSIS — M1990 Unspecified osteoarthritis, unspecified site: Secondary | ICD-10-CM | POA: Insufficient documentation

## 2015-04-08 DIAGNOSIS — Z791 Long term (current) use of non-steroidal anti-inflammatories (NSAID): Secondary | ICD-10-CM | POA: Diagnosis not present

## 2015-04-08 DIAGNOSIS — Z8639 Personal history of other endocrine, nutritional and metabolic disease: Secondary | ICD-10-CM | POA: Insufficient documentation

## 2015-04-09 ENCOUNTER — Emergency Department (HOSPITAL_COMMUNITY)
Admission: EM | Admit: 2015-04-09 | Discharge: 2015-04-09 | Disposition: A | Discharge status: Home / Self Care | Payer: Medicaid Other | Attending: Emergency Medicine | Admitting: Emergency Medicine

## 2015-04-09 ENCOUNTER — Encounter (HOSPITAL_COMMUNITY): Payer: Self-pay | Admitting: Emergency Medicine

## 2015-04-09 DIAGNOSIS — M1990 Unspecified osteoarthritis, unspecified site: Secondary | ICD-10-CM

## 2015-04-09 HISTORY — DX: Unspecified osteoarthritis, unspecified site: M19.90

## 2015-04-09 MED ORDER — MELOXICAM 15 MG PO TABS: 15.0000 mg | ORAL_TABLET | Freq: Every day | ORAL | Status: DC

## 2015-04-09 MED ORDER — KETOROLAC TROMETHAMINE 60 MG/2ML IM SOLN
60.0000 mg | Freq: Once | INTRAMUSCULAR | Status: AC
  Administered 2015-04-09: 60 mg via INTRAMUSCULAR
  Filled 2015-04-09: qty 2

## 2015-04-09 NOTE — ED Notes (Signed)
Preparing for discharge.

## 2015-04-09 NOTE — ED Notes (Signed)
Pt. reports ( using Arabic interpreter) arthritic joint pains / swelling at hands , fingers , knees and generalized body aches onset this evening . Pt. stated she ran out of her prescription medications for pain . Denies fever or chills.

## 2015-04-09 NOTE — ED Notes (Addendum)
Use of arabic interpreter, patient is reporting joint pain in multiple areas. Reports she had surgery on both hands "to release nerves", and points towards prescription medication "tramadol and naproxen" that she takes at home. She reports this medicaiton doesn't work for joint pain.

## 2015-04-09 NOTE — ED Notes (Signed)
Dr. Nicanor Alcon at the bedside with use of interpreter.

## 2015-04-09 NOTE — ED Provider Notes (Signed)
CSN: [id number]     Arrival date & time 04/08/15  2341 History  This chart was scribed for Kavonte Bearse, MD by Annye Asa, ED Scribe. This patient was seen in room B17C/B17C and the patient's care was started at 1:35 AM.    Chief Complaint  Patient presents with  . Arthritis   Patient is a 54 y.o. female presenting with arthritis. The history is provided by the patient. A language interpreter was used (Patient consented to Arabic interpreter via phone).  Arthritis This is a chronic problem. The current episode started more than 1 week ago. The problem occurs constantly. The problem has been gradually worsening. Pertinent negatives include no chest pain, no abdominal pain, no headaches and no shortness of breath. Exacerbated by: Movement of the affected areas; grip, applied pressure. Nothing relieves the symptoms. Treatments tried: Ultram and naproxen. The treatment provided no relief.    HPI Comments: Elice Crigger is a 54 y.o. female with past medical history of rheumatoid arthritis who presents to the Emergency Department complaining of 2 months arthritic pain to both hands, wrists, knees, and ankles with associated mild swelling to the area. Patient states her daily activities are impacted by this pain, states "I can't move, I can't do anything due to pain." She is currently taking Ultram and Naproxen without relief. She has previously been prescribed other medications that did not improve her discomfort, though she is unable to recall the specific prescriptions at this time. Patient is neither icing nor elevating her hands. She denies any recent trauma or injury, denies any other complaints.   Patient has an appointment on 05/14/15 with a specialist but was told to take her current medications until that time.   Past Medical History  Diagnosis Date  . Back pain   . Scoliosis   . Spinal stenosis   . Post-surgical hypothyroidism   . Arthritis    Past Surgical History  Procedure  Laterality Date  . Back surgery      x 3 (last in 2009)  . Lumbar disc surgery    . Carpal tunnel release      2007  . Thyroidectomy      2006   Family History  Problem Relation Age of Onset  . Diabetes Neg Hx   . Hyperlipidemia Neg Hx   . Hypertension Neg Hx   . Heart failure Neg Hx    History  Substance Use Topics  . Smoking status: Former Games developer  . Smokeless tobacco: Not on file  . Alcohol Use: No   OB History    No data available     Review of Systems  Respiratory: Negative for shortness of breath.   Cardiovascular: Negative for chest pain.  Gastrointestinal: Negative for abdominal pain.  Musculoskeletal: Positive for joint swelling, arthralgias and arthritis. Negative for neck pain and neck stiffness.  Skin: Negative for color change.  Neurological: Negative for headaches.  All other systems reviewed and are negative.  Allergies  Review of patient's allergies indicates no known allergies.  Home Medications   Prior to Admission medications   Medication Sig Start Date End Date Taking? Authorizing Provider  diphenhydrAMINE (BENADRYL) 25 MG tablet Take 25 mg by mouth every 6 (six) hours as needed for allergies (allergies).    Historical Provider, MD  naproxen (NAPROSYN) 500 MG tablet Take 1 tablet (500 mg total) by mouth 2 (two) times daily with a meal. 03/21/15   Abram Sander, MD  predniSONE (DELTASONE) 50 MG tablet Take  1 tablet (50 mg total) by mouth daily with breakfast. 03/21/15   Abram Sander, MD  traMADol (ULTRAM) 50 MG tablet Take 1 tablet (50 mg total) by mouth every 6 (six) hours as needed for severe pain. 03/21/15   Abram Sander, MD   BP 109/52 mmHg  Pulse 96  Temp(Src) 98.4 F (36.9 C) (Oral)  Resp 14  Ht 5\' 4"  (1.626 m)  Wt 157 lb (71.215 kg)  BMI 26.94 kg/m2  SpO2 100% Physical Exam  Constitutional: She is oriented to person, place, and time. She appears well-developed and well-nourished. No distress.  HENT:  Head: Normocephalic and atraumatic.   Mouth/Throat: Oropharynx is clear and moist. No oropharyngeal exudate.  Moist mucous membranes  Eyes: EOM are normal. Pupils are equal, round, and reactive to light.  Neck: Normal range of motion. Neck supple. No JVD present. No tracheal deviation present.  Cardiovascular: Normal rate, regular rhythm and normal heart sounds.  Exam reveals no gallop and no friction rub.   No murmur heard. Pulmonary/Chest: Effort normal and breath sounds normal. No respiratory distress. She has no wheezes. She has no rales.  Abdominal: Soft. Bowel sounds are normal. She exhibits no mass. There is no tenderness. There is no rebound and no guarding.  Musculoskeletal: Normal range of motion. She exhibits no edema.  3+ radial pulses; cap refill to hands is < 2. Good DP pulses bilaterally. All compartments are soft.   Lymphadenopathy:    She has no cervical adenopathy.  Neurological: She is alert and oriented to person, place, and time. She has normal reflexes. She displays normal reflexes.  Skin: Skin is warm and dry. No rash noted.  Psychiatric: She has a normal mood and affect. Her behavior is normal.  Nursing note and vitals reviewed.   ED Course  Procedures   DIAGNOSTIC STUDIES: Oxygen Saturation is 99% on RA, normal by my interpretation.    COORDINATION OF CARE: 1:43 AM Discussed treatment plan with pt at bedside and pt agreed to plan.  1:50 AM Discussed plan with care team who confirmed patient's current scheduled appointment and suggested switching patient to Mobic for pain management.     Labs Review Labs Reviewed - No data to display  Imaging Review No results found.   EKG Interpretation None      MDM   Final diagnoses:  None   Case d/w Dr. of family practice call for same day appointment and start mobic, d/c naproxen.     I personally performed the services described in this documentation, which was scribed in my presence. The recorded information has been reviewed and is  accurate.      Paulina Fusi, MD 04/09/15 [phone]

## 2015-04-10 ENCOUNTER — Other Ambulatory Visit: Payer: Self-pay | Admitting: *Deleted

## 2015-04-10 DIAGNOSIS — M069 Rheumatoid arthritis, unspecified: Secondary | ICD-10-CM

## 2015-04-11 ENCOUNTER — Other Ambulatory Visit: Payer: Self-pay | Admitting: Family Medicine

## 2015-04-11 DIAGNOSIS — M069 Rheumatoid arthritis, unspecified: Secondary | ICD-10-CM

## 2015-04-11 MED ORDER — PREDNISONE 50 MG PO TABS: 50.0000 mg | ORAL_TABLET | Freq: Every day | ORAL | Status: DC

## 2015-04-17 ENCOUNTER — Other Ambulatory Visit: Payer: Self-pay | Admitting: *Deleted

## 2015-04-17 DIAGNOSIS — M069 Rheumatoid arthritis, unspecified: Secondary | ICD-10-CM

## 2015-04-19 ENCOUNTER — Ambulatory Visit: Payer: Medicaid Other | Admitting: Family Medicine

## 2015-04-26 ENCOUNTER — Ambulatory Visit (INDEPENDENT_AMBULATORY_CARE_PROVIDER_SITE_OTHER): Payer: Medicaid Other | Admitting: Family Medicine

## 2015-04-26 ENCOUNTER — Encounter: Payer: Self-pay | Admitting: Family Medicine

## 2015-04-26 VITALS — BP 101/68 | HR 97 | Temp 98.1°F | Ht 64.0 in | Wt 152.0 lb

## 2015-04-26 DIAGNOSIS — M21372 Foot drop, left foot: Secondary | ICD-10-CM | POA: Diagnosis not present

## 2015-04-26 DIAGNOSIS — M069 Rheumatoid arthritis, unspecified: Secondary | ICD-10-CM

## 2015-04-26 MED ORDER — MELOXICAM 15 MG PO TABS: 15.0000 mg | ORAL_TABLET | Freq: Every day | ORAL | Status: DC

## 2015-04-26 NOTE — Progress Notes (Signed)
Subjective:   Examined and interviewed with Arabic interpreter today.   Ashley Bates is a 54 y.o. female who presents to [facility] today for several issues:  1.  Joint pains:  Describes pain and stiffness in shoulders, elbows, wrists, knees.  Awakens sometimes with hand contractures at night, better in AM with massage and heat. Diagnosed with Rheumatoid Arthritis while in [place].  Has not seen a Rheumatologist here.   However, she DOES have an appoitnment later this month with [facility]  2.  Back pain and foot drop:  Seen by neurologist and had nerve conduction studies.  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  Still having difficulty ambulating secondary to her foot drop.  Doesn't know what to do to help with this besides changing her gait.  No actual pain in her foot, just joints as above.    ROS as above per HPI, otherwise neg.   The following portions of the patient's history were reviewed and updated as appropriate: allergies, current medications, past medical history, family and social history, and problem list. Patient is a nonsmoker.    PMH reviewed.  Past Medical History  Diagnosis Date  . Back pain   . Scoliosis   . Spinal stenosis   . Post-surgical hypothyroidism   . Arthritis    Past Surgical History  Procedure Laterality Date  . Back surgery      x 3 (last in 2009)  . Lumbar disc surgery    . Carpal tunnel release      2007  . Thyroidectomy      2006    Medications reviewed. Current Outpatient Prescriptions  Medication Sig Dispense Refill  . diphenhydrAMINE (BENADRYL) 25 MG tablet Take 25 mg by mouth every 6 (six) hours as needed for allergies (allergies).    . meloxicam (MOBIC) 15 MG tablet Take 1 tablet (15 mg total) by mouth daily. 10 tablet 0  . predniSONE (DELTASONE) 50 MG tablet Take 1 tablet (50 mg total) by mouth daily with breakfast. 10 tablet 0  . traMADol (ULTRAM) 50 MG tablet Take 1 tablet (50 mg total) by mouth every  6 (six) hours as needed for severe pain. 120 tablet 5   No current facility-administered medications for this visit.     Objective:   Physical Exam BP 101/68 mmHg  Pulse 97  Temp(Src) 98.1 F (36.7 C) (Oral)  Ht 5\' 4"  (1.626 m)  Wt 152 lb (68.947 kg)  BMI 26.08 kg/m2 Gen:  Alert, cooperative patient who appears stated age in no acute distress.  Vital signs reviewed. HEENT: EOMI.  No evidence of conjunctivitis or iritis.  MMM Cardiac:  Regular rate and rhythm  Pulm:  Clear to auscultation bilaterally  Exts: Trace edema BL ankles.   MSK:  +1 pitting edema BL hands/wrists.  No redness.  Has tenderness across dorsum of hand, olecranon of BL elbows without noticeable effusion.  No shoulder or knee effusion.  Does have some mild TTP along medial and lateral aspects of knees.   Skin:  No lesions noted.    No results found for this or any previous visit (from the past 72 hour(s)).

## 2015-04-26 NOTE — Patient Instructions (Signed)
The Meloxicam and the Tramadol is just pain relief.  The June 28th appt will be with Dr. Corliss Skains the Rheumatologist.  This will be for treatment.  The appt is at 9:15.   [address], [place], [place] [address]  Also referring you to Sports Medicine doctor who will help you find a shoe for your Left foot drop.    Checking labs today.

## 2015-04-27 LAB — CBC
HCT: 35.7 % — ABNORMAL LOW (ref 36.0–46.0)
HEMOGLOBIN: 11.5 g/dL — AB (ref 12.0–15.0)
MCH: 27.4 pg (ref 26.0–34.0)
MCHC: 32.2 g/dL (ref 30.0–36.0)
MCV: 85.2 fL (ref 78.0–100.0)
MPV: 11.7 fL (ref 8.6–12.4)
PLATELETS: 287 10*3/uL (ref 150–400)
RBC: 4.19 MIL/uL (ref 3.87–5.11)
RDW: 15.1 % (ref 11.5–15.5)
WBC: 8.5 10*3/uL (ref 4.0–10.5)

## 2015-04-27 LAB — IRON AND TIBC
%SAT: 8 % — ABNORMAL LOW (ref 20–55)
Iron: 21 ug/dL — ABNORMAL LOW (ref 42–145)
TIBC: 261 ug/dL (ref 250–470)
UIBC: 240 ug/dL (ref 125–400)

## 2015-04-27 LAB — C-REACTIVE PROTEIN: CRP: 8.4 mg/dL — ABNORMAL HIGH (ref <0.60)

## 2015-04-27 LAB — BASIC METABOLIC PANEL WITH GFR
BUN: 11 mg/dL (ref 6–23)
CO2: 21 meq/L (ref 19–32)
Calcium: 8.9 mg/dL (ref 8.4–10.5)
Chloride: 100 meq/L (ref 96–112)
Creat: 0.66 mg/dL (ref 0.50–1.10)
Glucose, Bld: 86 mg/dL (ref 70–99)
Potassium: 4.2 meq/L (ref 3.5–5.3)
Sodium: 140 meq/L (ref 135–145)

## 2015-04-27 LAB — FERRITIN: Ferritin: 220 ng/mL (ref 10–291)

## 2015-04-27 LAB — SEDIMENTATION RATE: Sed Rate: 128 mm/hr — ABNORMAL HIGH (ref 0–30)

## 2015-04-29 LAB — ANTI-NUCLEAR AB-TITER (ANA TITER): ANA Titer 1: NEGATIVE

## 2015-04-29 LAB — ANA: ANA: POSITIVE — AB

## 2015-04-30 LAB — CYCLIC CITRUL PEPTIDE ANTIBODY, IGG: Cyclic Citrullin Peptide Ab: >300 U/mL — ABNORMAL HIGH (ref 0.0–5.0)

## 2015-04-30 NOTE — Assessment & Plan Note (Signed)
Peroneal nerve damage.   Referral to sports medicine for fitting of specialized foot or boot to help her ambulate.  Finances may limit what she can afford to purchase.

## 2015-04-30 NOTE — Assessment & Plan Note (Addendum)
Obtaining more rheumatological labs todays.  Will mail these to patient so she has them available for her visit with Rheumatologist later this month.  Refill for short-term NSAID plus Tramadol.  Discussed this was just pain relief and that treatment will be in conjunction with Rheumatologist's recommendations.   FU with me in 1 month -- which will be after she has seen Rheum.  She has noticeable effusion in her wrists currently.  Has not been on Prednisone for several months.

## 2015-05-01 ENCOUNTER — Encounter: Payer: Self-pay | Admitting: Family Medicine

## 2015-05-08 ENCOUNTER — Encounter: Payer: Self-pay | Admitting: Sports Medicine

## 2015-05-08 ENCOUNTER — Ambulatory Visit (INDEPENDENT_AMBULATORY_CARE_PROVIDER_SITE_OTHER): Payer: Medicaid Other | Admitting: Sports Medicine

## 2015-05-08 VITALS — BP 96/63 | Ht 62.99 in | Wt 143.3 lb

## 2015-05-08 DIAGNOSIS — M069 Rheumatoid arthritis, unspecified: Secondary | ICD-10-CM | POA: Diagnosis not present

## 2015-05-08 DIAGNOSIS — M21372 Foot drop, left foot: Secondary | ICD-10-CM

## 2015-05-08 DIAGNOSIS — M4806 Spinal stenosis, lumbar region: Secondary | ICD-10-CM | POA: Diagnosis present

## 2015-05-08 DIAGNOSIS — M48061 Spinal stenosis, lumbar region without neurogenic claudication: Secondary | ICD-10-CM

## 2015-05-08 NOTE — Progress Notes (Signed)
See addendum

## 2015-05-14 ENCOUNTER — Ambulatory Visit (HOSPITAL_COMMUNITY)
Admission: RE | Admit: 2015-05-14 | Discharge: 2015-05-14 | Disposition: A | Discharge status: Home / Self Care | Payer: Medicaid Other | Source: Ambulatory Visit | Attending: Rheumatology | Admitting: Rheumatology

## 2015-05-14 ENCOUNTER — Other Ambulatory Visit (HOSPITAL_COMMUNITY): Payer: Self-pay | Admitting: Rheumatology

## 2015-05-14 DIAGNOSIS — M069 Rheumatoid arthritis, unspecified: Secondary | ICD-10-CM | POA: Diagnosis not present

## 2015-05-14 DIAGNOSIS — Z09 Encounter for follow-up examination after completed treatment for conditions other than malignant neoplasm: Secondary | ICD-10-CM

## 2015-05-14 DIAGNOSIS — J439 Emphysema, unspecified: Secondary | ICD-10-CM | POA: Diagnosis not present

## 2015-05-26 ENCOUNTER — Other Ambulatory Visit: Payer: Self-pay | Admitting: Family Medicine

## 2015-05-26 DIAGNOSIS — M069 Rheumatoid arthritis, unspecified: Secondary | ICD-10-CM

## 2015-07-03 ENCOUNTER — Ambulatory Visit: Payer: Medicaid Other | Admitting: Family Medicine

## 2015-07-08 DIAGNOSIS — Z0289 Encounter for other administrative examinations: Secondary | ICD-10-CM

## 2015-07-16 ENCOUNTER — Ambulatory Visit (INDEPENDENT_AMBULATORY_CARE_PROVIDER_SITE_OTHER): Payer: Medicaid Other | Admitting: Family Medicine

## 2015-07-16 ENCOUNTER — Encounter: Payer: Self-pay | Admitting: Family Medicine

## 2015-07-16 VITALS — BP 121/71 | HR 64 | Temp 99.8°F | Ht 64.0 in | Wt 140.9 lb

## 2015-07-16 DIAGNOSIS — M069 Rheumatoid arthritis, unspecified: Secondary | ICD-10-CM | POA: Diagnosis not present

## 2015-07-16 DIAGNOSIS — R946 Abnormal results of thyroid function studies: Secondary | ICD-10-CM

## 2015-07-16 DIAGNOSIS — Z23 Encounter for immunization: Secondary | ICD-10-CM

## 2015-07-16 DIAGNOSIS — R7989 Other specified abnormal findings of blood chemistry: Secondary | ICD-10-CM

## 2015-07-16 LAB — T4, FREE: Free T4: 0.81 ng/dL (ref 0.80–1.80)

## 2015-07-16 LAB — T3, FREE: T3, Free: 2.6 pg/mL (ref 2.3–4.2)

## 2015-07-16 LAB — TSH: TSH: 5.532 u[IU]/mL — AB (ref 0.350–4.500)

## 2015-07-16 NOTE — Patient Instructions (Signed)
I am checking your thyroid level with blood tests today. We will call you with the results and to let you know if anything else will be needed. Please come back to discuss these results next month.

## 2015-07-16 NOTE — Assessment & Plan Note (Signed)
Starting methotrexate per rheum (deveshwar), wants pneumovax prior to this and CXR - gave pneumovax today - per records had normal CXR in June, no symptoms, unsure of utility of repeat imaging so will leave this to rheum to determine and order - asked patient to have records sent to me from all future visits

## 2015-07-16 NOTE — Progress Notes (Signed)
   Subjective:   Ashley Bates is a 54 y.o. female with a history of RA, reported thyroidectomy here for f/u elevated TSH and desires pneumovax  Patient reports that many years ago her thyroid was "removed" in order to confirm that she did not have cancer, it was benign from what she remembers. She has never taken any medication for her thyroid. She is not experiencing and changes in her skin or hair, heat or cold intolerance, constipation, palpitations, weight change.  Review of Systems:  Per HPI. All other systems reviewed and are negative.   PMH, PSH, Medications, Allergies, and FmHx reviewed and updated in EMR.  Social History: former smoker  Objective:  BP 121/71 mmHg  Pulse 64  Temp(Src) 99.8 F (37.7 C) (Oral)  Ht 5\' 4"  (1.626 m)  Wt 140 lb 14.4 oz (63.912 kg)  BMI 24.17 kg/m2  Gen:  54 y.o. female in NAD HEENT: NCAT, MMM, EOMI, PERRL, anicteric sclerae CV: RRR, no MRG, no JVD Neck: thyroid not enlarged, no palpable nodules Resp: Non-labored, CTAB, no wheezes noted Abd: Soft, NTND, BS present, no guarding or organomegaly Ext: WWP, no edema MSK: Full ROM, strength intact Neuro: Alert and oriented, speech normal      Chemistry      Component Value Date/Time   NA 140 04/26/2015 1208   K 4.2 04/26/2015 1208   CL 100 04/26/2015 1208   CO2 21 04/26/2015 1208   BUN 11 04/26/2015 1208   CREATININE 0.66 04/26/2015 1208   CREATININE 0.62 02/25/2015 1525      Component Value Date/Time   CALCIUM 8.9 04/26/2015 1208   ALKPHOS 47 02/25/2015 1525   AST 15 02/25/2015 1525   ALT 14 02/25/2015 1525   BILITOT 0.7 02/25/2015 1525      Lab Results  Component Value Date   WBC 8.5 04/26/2015   HGB 11.5* 04/26/2015   HCT 35.7* 04/26/2015   MCV 85.2 04/26/2015   PLT 287 04/26/2015   No results found for: TSH No results found for: HGBA1C Assessment & Plan:     Ashley Bates is a 54 y.o. female here for f/u TSH  RA (rheumatoid arthritis) Starting  methotrexate per rheum (deveshwar), wants pneumovax prior to this and CXR - gave pneumovax today - per records had normal CXR in June, no symptoms, unsure of utility of repeat imaging so will leave this to rheum to determine and order - asked patient to have records sent to me from all future visits   Elevated TSH TSH of 4.7 at rheum visit, per patient had thyroid "removed" years ago but was never on medication, assume this was partial thyroidectomy given barely elevated TSH on no meds, normal exam - recheck TSH, FT4, FT3    July, MD, MPH [facility] PGY-3 07/16/2015 3:27 PM

## 2015-07-16 NOTE — Assessment & Plan Note (Addendum)
TSH of 4.7 at rheum visit, per patient had thyroid "removed" years ago but was never on medication, assume this was partial thyroidectomy given barely elevated TSH on no meds, normal exam - recheck TSH, FT4, FT3

## 2015-08-13 ENCOUNTER — Ambulatory Visit: Payer: Medicaid Other | Admitting: Family Medicine

## 2016-01-23 IMAGING — DX DG KNEE COMPLETE 4+V*L*
4 series · 4 of 4 positions shown · non-contrast
Comparison: None.

CLINICAL DATA: Chronic left knee pain.  No known injury.

EXAM:
LEFT KNEE - COMPLETE 4+ VIEW

[knee ap]
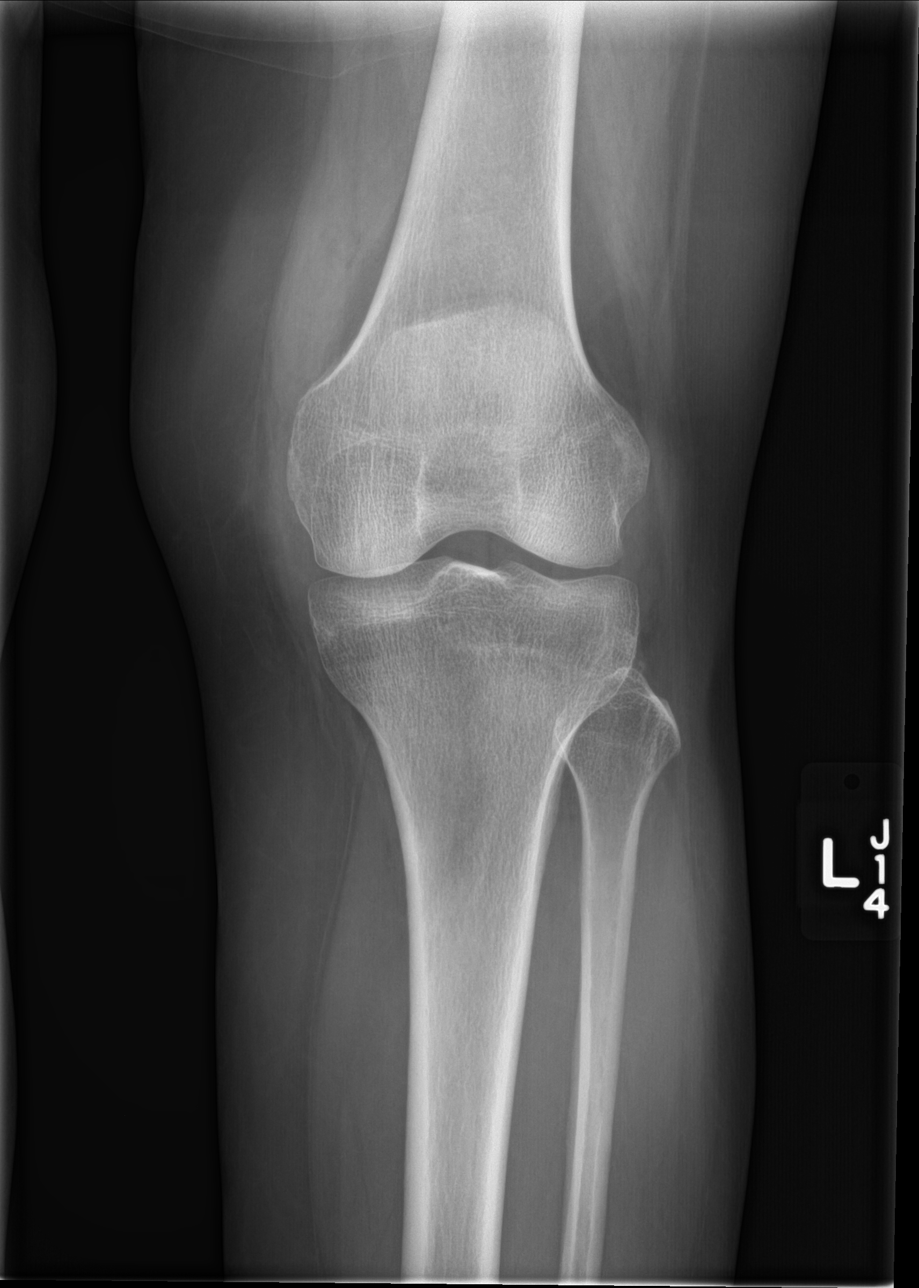

[knee obl (1 of 2)]
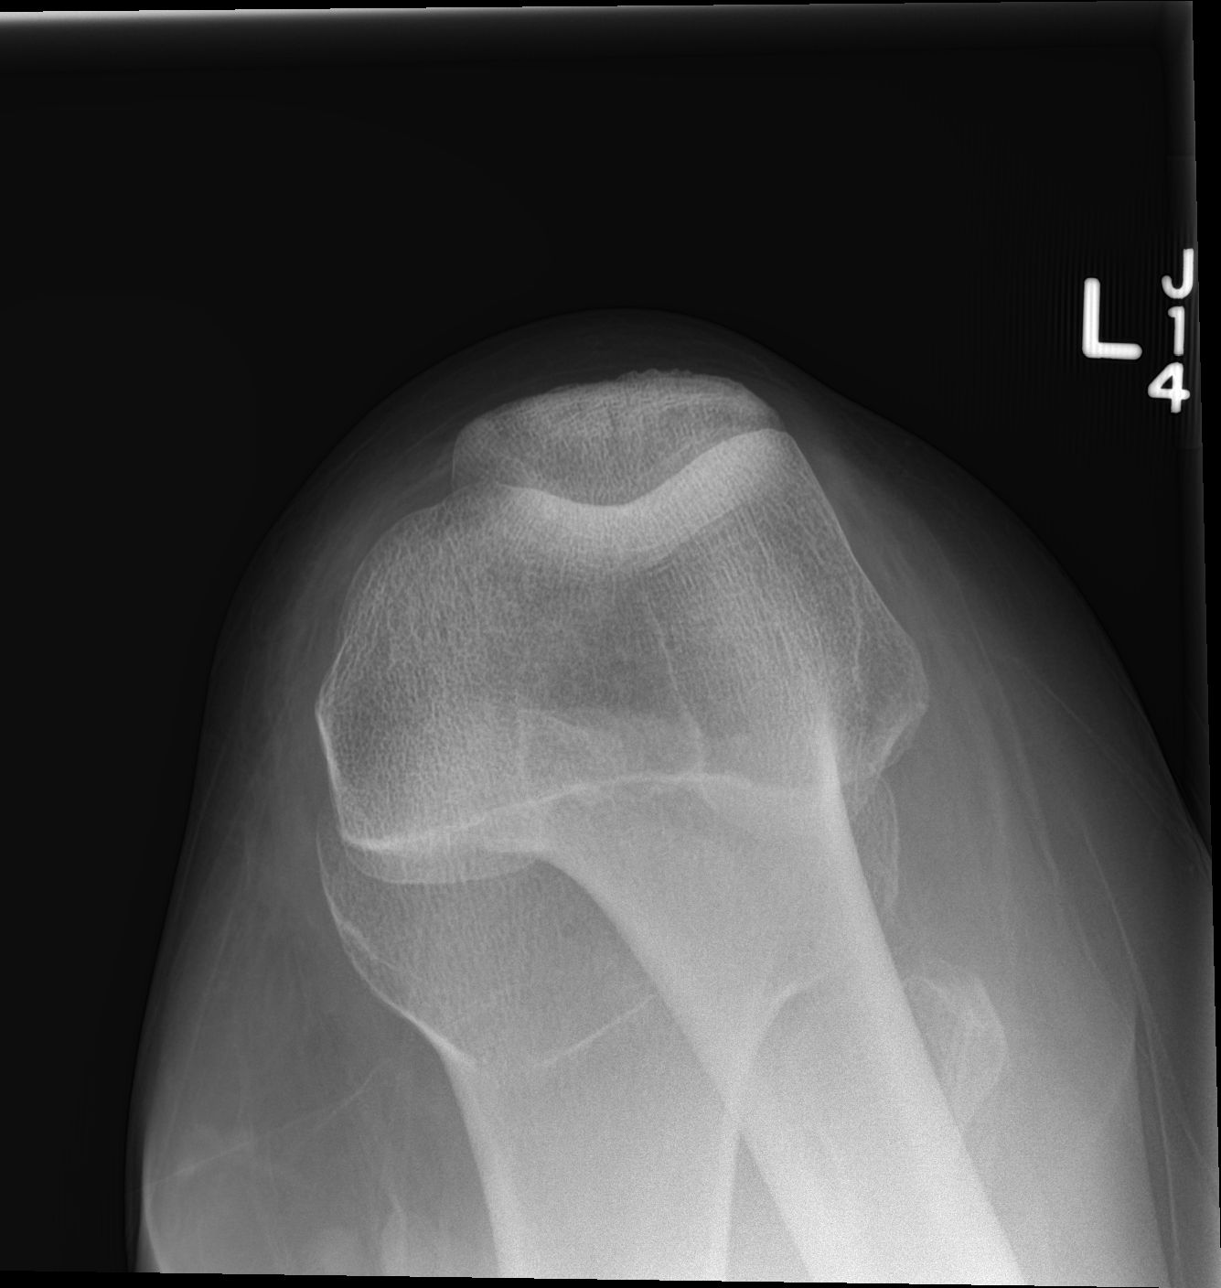

[knee obl (2 of 2)]
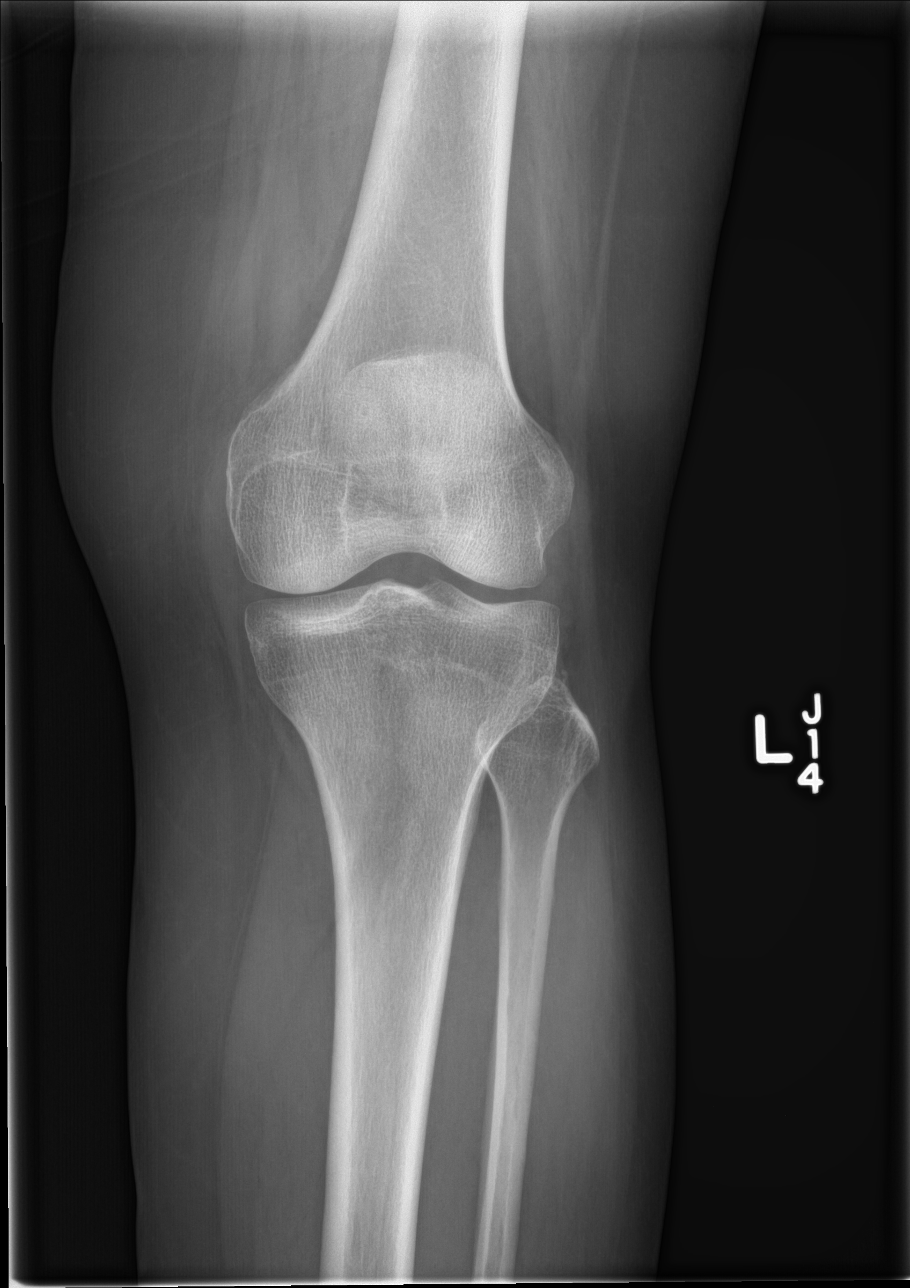

[knee lat]
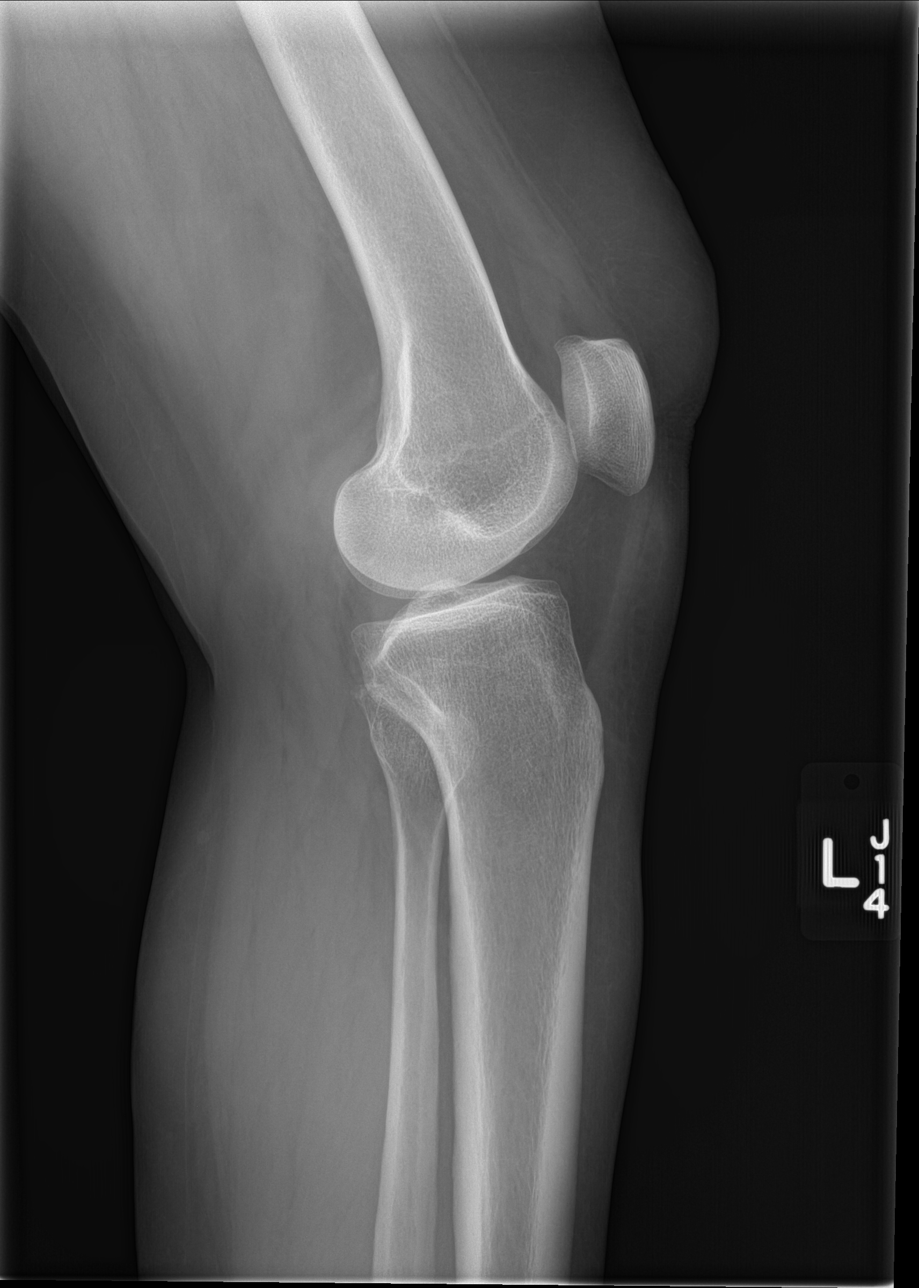

[4 of 4 positions shown; findings below may reference images not displayed]

FINDINGS: No acute bony or joint abnormality is identified. Joint spaces are
preserved. Very small joint effusion is noted.
IMPRESSION: Small joint effusion.  Otherwise negative.

## 2016-02-20 ENCOUNTER — Encounter (HOSPITAL_COMMUNITY): Payer: Self-pay | Admitting: *Deleted

## 2016-02-20 ENCOUNTER — Emergency Department (HOSPITAL_COMMUNITY)
Admission: EM | Admit: 2016-02-20 | Discharge: 2016-02-21 | Disposition: A | Discharge status: Home / Self Care | Payer: Medicaid Other | Attending: Emergency Medicine | Admitting: Emergency Medicine

## 2016-02-20 ENCOUNTER — Encounter (HOSPITAL_COMMUNITY): Payer: Self-pay | Admitting: Emergency Medicine

## 2016-02-20 ENCOUNTER — Ambulatory Visit (HOSPITAL_COMMUNITY)
Admission: EM | Admit: 2016-02-20 | Discharge: 2016-02-20 | Disposition: A | Discharge status: Nursing Home (Includes ICF) | Payer: Medicaid Other | Attending: Family Medicine | Admitting: Family Medicine

## 2016-02-20 DIAGNOSIS — M1990 Unspecified osteoarthritis, unspecified site: Secondary | ICD-10-CM | POA: Diagnosis not present

## 2016-02-20 DIAGNOSIS — Z79899 Other long term (current) drug therapy: Secondary | ICD-10-CM | POA: Insufficient documentation

## 2016-02-20 DIAGNOSIS — R1032 Left lower quadrant pain: Secondary | ICD-10-CM

## 2016-02-20 DIAGNOSIS — N390 Urinary tract infection, site not specified: Secondary | ICD-10-CM | POA: Diagnosis not present

## 2016-02-20 DIAGNOSIS — R109 Unspecified abdominal pain: Secondary | ICD-10-CM | POA: Diagnosis present

## 2016-02-20 DIAGNOSIS — Z87891 Personal history of nicotine dependence: Secondary | ICD-10-CM | POA: Insufficient documentation

## 2016-02-20 LAB — CBC
HEMATOCRIT: 37.7 % (ref 36.0–46.0)
HEMOGLOBIN: 12.2 g/dL (ref 12.0–15.0)
MCH: 29.6 pg (ref 26.0–34.0)
MCHC: 32.4 g/dL (ref 30.0–36.0)
MCV: 91.5 fL (ref 78.0–100.0)
Platelets: 195 10*3/uL (ref 150–400)
RBC: 4.12 MIL/uL (ref 3.87–5.11)
RDW: 13.5 % (ref 11.5–15.5)
WBC: 7.9 10*3/uL (ref 4.0–10.5)

## 2016-02-20 LAB — URINE MICROSCOPIC-ADD ON

## 2016-02-20 LAB — POCT URINALYSIS DIP (DEVICE)
Bilirubin Urine: NEGATIVE
Glucose, UA: NEGATIVE mg/dL
NITRITE: NEGATIVE
PH: 6 (ref 5.0–8.0)
Protein, ur: 30 mg/dL — AB
Specific Gravity, Urine: >=1.03 (ref 1.005–1.030)
UROBILINOGEN UA: 0.2 mg/dL (ref 0.0–1.0)

## 2016-02-20 LAB — COMPREHENSIVE METABOLIC PANEL
ALT: 12 U/L — ABNORMAL LOW (ref 14–54)
ANION GAP: 11 (ref 5–15)
AST: 18 U/L (ref 15–41)
Albumin: 3.8 g/dL (ref 3.5–5.0)
Alkaline Phosphatase: 57 U/L (ref 38–126)
BILIRUBIN TOTAL: 0.8 mg/dL (ref 0.3–1.2)
BUN: 11 mg/dL (ref 6–20)
CO2: 26 mmol/L (ref 22–32)
Calcium: 9.4 mg/dL (ref 8.9–10.3)
Chloride: 105 mmol/L (ref 101–111)
Creatinine, Ser: 0.74 mg/dL (ref 0.44–1.00)
GFR calc Af Amer: >60 mL/min (ref >60)
Glucose, Bld: 99 mg/dL (ref 65–99)
POTASSIUM: 4.1 mmol/L (ref 3.5–5.1)
Sodium: 142 mmol/L (ref 135–145)
TOTAL PROTEIN: 7 g/dL (ref 6.5–8.1)

## 2016-02-20 LAB — URINALYSIS, ROUTINE W REFLEX MICROSCOPIC
Bilirubin Urine: NEGATIVE
Glucose, UA: NEGATIVE mg/dL
KETONES UR: 15 mg/dL — AB
NITRITE: NEGATIVE
PH: 5.5 (ref 5.0–8.0)
PROTEIN: NEGATIVE mg/dL
SPECIFIC GRAVITY, URINE: 1.033 — AB (ref 1.005–1.030)

## 2016-02-20 LAB — LIPASE, BLOOD: Lipase: 31 U/L (ref 11–51)

## 2016-02-20 MED ORDER — MORPHINE SULFATE (PF) 4 MG/ML IV SOLN
4.0000 mg | Freq: Once | INTRAVENOUS | Status: AC
  Administered 2016-02-20: 4 mg via INTRAVENOUS
  Filled 2016-02-20: qty 1

## 2016-02-20 MED ORDER — ONDANSETRON HCL 4 MG/2ML IJ SOLN
4.0000 mg | Freq: Once | INTRAMUSCULAR | Status: AC
  Administered 2016-02-20: 4 mg via INTRAVENOUS
  Filled 2016-02-20: qty 2

## 2016-02-20 MED ORDER — SODIUM CHLORIDE 0.9 % IV BOLUS (SEPSIS)
1000.0000 mL | Freq: Once | INTRAVENOUS | Status: AC
  Administered 2016-02-20: 1000 mL via INTRAVENOUS

## 2016-02-20 NOTE — ED Provider Notes (Signed)
CSN: [id number]     Arrival date & time 02/20/16  1820 History   First MD Initiated Contact with Patient 02/20/16 1940     Chief Complaint  Patient presents with  . Abdominal Pain   (Consider location/radiation/quality/duration/timing/severity/associated sxs/prior Treatment) Patient is a 55 y.o. female presenting with abdominal pain. The history is provided by the patient and a relative. The history is limited by a language barrier. A language interpreter was used (son interp).  Abdominal Pain Pain location:  LLQ Pain quality: cramping and sharp   Pain radiates to:  Does not radiate Pain severity:  Moderate Onset quality:  Sudden Duration:  4 days Timing:  Constant Chronicity:  New (no prior hx of pain.) Associated symptoms: no anorexia, no belching, no diarrhea, no dysuria, no fever, no nausea and no vomiting     Past Medical History  Diagnosis Date  . Back pain   . Scoliosis   . Spinal stenosis   . Post-surgical hypothyroidism   . Arthritis    Past Surgical History  Procedure Laterality Date  . Back surgery      x 3 (last in 2009)  . Lumbar disc surgery    . Carpal tunnel release      2007  . Thyroidectomy      2006   Family History  Problem Relation Age of Onset  . Diabetes Neg Hx   . Hyperlipidemia Neg Hx   . Hypertension Neg Hx   . Heart failure Neg Hx    Social History  Substance Use Topics  . Smoking status: Former Games developer  . Smokeless tobacco: None  . Alcohol Use: No   OB History    No data available     Review of Systems  Constitutional: Negative for fever.  Respiratory: Negative.   Gastrointestinal: Positive for abdominal pain. Negative for nausea, vomiting, diarrhea and anorexia.  Genitourinary: Negative.  Negative for dysuria.  All other systems reviewed and are negative.   Allergies  Review of patient's allergies indicates no known allergies.  Home Medications   Prior to Admission medications   Medication Sig Start Date End Date Taking?  Authorizing Provider  folic acid (FOLVITE) 400 MCG tablet Take 400 mcg by mouth daily.   Yes Historical Provider, MD  methotrexate (RHEUMATREX) 2.5 MG tablet  06/17/15  Yes Historical Provider, MD  diphenhydrAMINE (BENADRYL) 25 MG tablet Take 25 mg by mouth every 6 (six) hours as needed for allergies (allergies).    Historical Provider, MD  meloxicam (MOBIC) 15 MG tablet TAKE 1 TABLET BY MOUTH EVERY DAY 05/27/15   Abram Sander, MD  methocarbamol (ROBAXIN) 500 MG tablet TAKE 1 TO 2 TABLETS BY MOUTH EVERY 6 HOURS AS NEEDED FOR MUSCLE SPASMS 02/28/15   Historical Provider, MD  naproxen (NAPROSYN) 500 MG tablet TK 1 T PO  BID WITH A MEAL 03/21/15   Historical Provider, MD  ofloxacin (OCUFLOX) 0.3 % ophthalmic solution  07/08/15   Historical Provider, MD  predniSONE (DELTASONE) 50 MG tablet TK 1 T PO D WITH BREAKFAST 04/08/15   Historical Provider, MD  traMADol (ULTRAM) 50 MG tablet Take 1 tablet (50 mg total) by mouth every 6 (six) hours as needed for severe pain. 03/21/15   Abram Sander, MD   Meds Ordered and Administered this Visit  Medications - No data to display  BP 110/69 mmHg  Pulse 67  Temp(Src) 99.1 F (37.3 C) (Oral)  Resp 16  SpO2 98% No data found.   Physical Exam  Constitutional: She is oriented to person, place, and time. She appears well-developed and well-nourished. No distress.  Pulmonary/Chest: Effort normal and breath sounds normal.  Abdominal: Soft. She exhibits no distension and no mass. There is tenderness. There is no rebound and no guarding.  Neurological: She is alert and oriented to person, place, and time.  Skin: Skin is warm and dry.  Nursing note and vitals reviewed.   ED Course  Procedures (including critical care time)  Labs Review Labs Reviewed  POCT URINALYSIS DIP (DEVICE) - Abnormal; Notable for the following:    Ketones, ur TRACE (*)    Hgb urine dipstick MODERATE (*)    Protein, ur 30 (*)    Leukocytes, UA SMALL (*)    All other components within  normal limits   U/a abnl  Imaging Review No results found.   Visual Acuity Review  Right Eye Distance:   Left Eye Distance:   Bilateral Distance:    Right Eye Near:   Left Eye Near:    Bilateral Near:         MDM   1. Left lower quadrant pain    Sent to eval llq x4 days r/o ureteral stone vs divertic, pos. bld in u/a.    Linna Hoff, MD 02/20/16 2006

## 2016-02-20 NOTE — ED Notes (Signed)
Last bm  4 days ago

## 2016-02-20 NOTE — ED Provider Notes (Addendum)
CSN: [id number]     Arrival date & time 02/20/16  2018 History   First MD Initiated Contact with Patient 02/20/16 2221     Chief Complaint  Patient presents with  . Abdominal Pain     (Consider location/radiation/quality/duration/timing/severity/associated sxs/prior Treatment) Patient is a 55 y.o. female presenting with abdominal pain. The history is provided by the patient and a relative. A language interpreter was used.  Abdominal Pain Associated symptoms: no chest pain, no cough, no diarrhea, no dysuria, no fever, no hematuria, no shortness of breath and no vomiting   Patient presents c/o left lower abd pain for the past 4 days. Pain constant, dull, moderate, non radiating. No hx same pain. No back or flank pain. No dysuria or hematuria. Had normal bm today. Denies fever or chills. Nausea. No vomiting. No abd distension. No hx diverticula or kidney stones.       Past Medical History  Diagnosis Date  . Back pain   . Scoliosis   . Spinal stenosis   . Post-surgical hypothyroidism   . Arthritis    Past Surgical History  Procedure Laterality Date  . Back surgery      x 3 (last in 2009)  . Lumbar disc surgery    . Carpal tunnel release      2007  . Thyroidectomy      2006   Family History  Problem Relation Age of Onset  . Diabetes Neg Hx   . Hyperlipidemia Neg Hx   . Hypertension Neg Hx   . Heart failure Neg Hx    Social History  Substance Use Topics  . Smoking status: Former Games developer  . Smokeless tobacco: None  . Alcohol Use: No   OB History    No data available     Review of Systems  Constitutional: Negative for fever.  HENT: Negative for congestion.   Eyes: Negative for redness.  Respiratory: Negative for cough and shortness of breath.   Cardiovascular: Negative for chest pain.  Gastrointestinal: Positive for abdominal pain. Negative for vomiting and diarrhea.  Genitourinary: Negative for dysuria, hematuria and flank pain.  Musculoskeletal: Negative for back  pain.  Skin: Negative for rash.  Neurological: Negative for headaches.  Hematological: Does not bruise/bleed easily.  Psychiatric/Behavioral: Negative for confusion.      Allergies  Review of patient's allergies indicates no known allergies.  Home Medications   Prior to Admission medications   Medication Sig Start Date End Date Taking? Authorizing Provider  diphenhydrAMINE (BENADRYL) 25 MG tablet Take 25 mg by mouth every 6 (six) hours as needed for allergies (allergies).    Historical Provider, MD  folic acid (FOLVITE) 400 MCG tablet Take 400 mcg by mouth daily.    Historical Provider, MD  meloxicam (MOBIC) 15 MG tablet TAKE 1 TABLET BY MOUTH EVERY DAY 05/27/15   Abram Sander, MD  methocarbamol (ROBAXIN) 500 MG tablet TAKE 1 TO 2 TABLETS BY MOUTH EVERY 6 HOURS AS NEEDED FOR MUSCLE SPASMS 02/28/15   Historical Provider, MD  methotrexate (RHEUMATREX) 2.5 MG tablet  06/17/15   Historical Provider, MD  naproxen (NAPROSYN) 500 MG tablet TK 1 T PO  BID WITH A MEAL 03/21/15   Historical Provider, MD  ofloxacin (OCUFLOX) 0.3 % ophthalmic solution  07/08/15   Historical Provider, MD  predniSONE (DELTASONE) 50 MG tablet TK 1 T PO D WITH BREAKFAST 04/08/15   Historical Provider, MD  traMADol (ULTRAM) 50 MG tablet Take 1 tablet (50 mg total) by mouth every 6 (  six) hours as needed for severe pain. 03/21/15   Abram Sander, MD   BP 125/66 mmHg  Pulse 66  Temp(Src) 97.9 F (36.6 C)  Resp 18  Wt 66.339 kg  SpO2 96% Physical Exam  Constitutional: She appears well-developed and well-nourished. No distress.  HENT:  Mouth/Throat: Oropharynx is clear and moist.  Eyes: Conjunctivae are normal. No scleral icterus.  Neck: Neck supple. No tracheal deviation present.  Cardiovascular: Normal rate, regular rhythm, normal heart sounds and intact distal pulses.   Pulmonary/Chest: Effort normal and breath sounds normal. No respiratory distress.  Abdominal: Soft. Normal appearance and bowel sounds are normal. She  exhibits no distension and no mass. There is tenderness. There is no rebound and no guarding.  LLQ tenderness  Genitourinary:  No cva tenderness  Musculoskeletal: She exhibits no edema.  Neurological: She is alert.  Skin: Skin is warm and dry. No rash noted. She is not diaphoretic.  Nursing note and vitals reviewed.   ED Course  Procedures (including critical care time) Labs Review   Results for orders placed or performed during the hospital encounter of 02/20/16  Lipase, blood  Result Value Ref Range   Lipase 31 11 - 51 U/L  Comprehensive metabolic panel  Result Value Ref Range   Sodium 142 135 - 145 mmol/L   Potassium 4.1 3.5 - 5.1 mmol/L   Chloride 105 101 - 111 mmol/L   CO2 26 22 - 32 mmol/L   Glucose, Bld 99 65 - 99 mg/dL   BUN 11 6 - 20 mg/dL   Creatinine, Ser 2.35 0.44 - 1.00 mg/dL   Calcium 9.4 8.9 - 57.3 mg/dL   Total Protein 7.0 6.5 - 8.1 g/dL   Albumin 3.8 3.5 - 5.0 g/dL   AST 18 15 - 41 U/L   ALT 12 (L) 14 - 54 U/L   Alkaline Phosphatase 57 38 - 126 U/L   Total Bilirubin 0.8 0.3 - 1.2 mg/dL   GFR calc non Af Amer >60 >60 mL/min   GFR calc Af Amer >60 >60 mL/min   Anion gap 11 5 - 15  CBC  Result Value Ref Range   WBC 7.9 4.0 - 10.5 K/uL   RBC 4.12 3.87 - 5.11 MIL/uL   Hemoglobin 12.2 12.0 - 15.0 g/dL   HCT 22.0 25.4 - 27.0 %   MCV 91.5 78.0 - 100.0 fL   MCH 29.6 26.0 - 34.0 pg   MCHC 32.4 30.0 - 36.0 g/dL   RDW 62.3 76.2 - 83.1 %   Platelets 195 150 - 400 K/uL  Urinalysis, Routine w reflex microscopic (not at [facility])  Result Value Ref Range   Color, Urine YELLOW YELLOW   APPearance CLOUDY (A) CLEAR   Specific Gravity, Urine 1.033 (H) 1.005 - 1.030   pH 5.5 5.0 - 8.0   Glucose, UA NEGATIVE NEGATIVE mg/dL   Hgb urine dipstick SMALL (A) NEGATIVE   Bilirubin Urine NEGATIVE NEGATIVE   Ketones, ur 15 (A) NEGATIVE mg/dL   Protein, ur NEGATIVE NEGATIVE mg/dL   Nitrite NEGATIVE NEGATIVE   Leukocytes, UA MODERATE (A) NEGATIVE  Urine microscopic-add on   Result Value Ref Range   Squamous Epithelial / LPF 6-30 (A) NONE SEEN   WBC, UA 6-30 0 - 5 WBC/hpf   RBC / HPF 0-5 0 - 5 RBC/hpf   Bacteria, UA RARE (A) NONE SEEN   Urine-Other MUCOUS PRESENT        I have personally reviewed and evaluated these images  and lab results as part of my medical decision-making.    MDM   Iv ns bolus. Morphine iv.  zofran iv.  Pt sent from Urgent Care for CT.  CT ordered.  Reviewed nursing notes and prior charts for additional history.   Possible uti on labs, will plan on treating.  Ct remains pending.   1103 ct remains pending - signed out to Dr Mora Bellman to check ct when back, recheck pt, and dispo appropriately (including tx of possible uti if ct neg).       Cathren Laine, MD 02/21/16 [phone]

## 2016-02-20 NOTE — ED Notes (Signed)
The patient presented to the [facility] with a complaint of abdominal pain x 4 days. The patient denied any N/V/D or constipation.

## 2016-02-20 NOTE — ED Notes (Signed)
Pt sent from ucc  For treatment

## 2016-02-20 NOTE — ED Notes (Signed)
Pt c/o lt lower abd pain  For 3-4 days no n v or diarrhea

## 2016-02-20 NOTE — ED Notes (Signed)
Pt from [facility] for LLQ abdominal pain worse upon palpation. Provider sent to ER to r/o kidney stone vs diverticulitis per report.

## 2016-02-21 ENCOUNTER — Emergency Department (HOSPITAL_COMMUNITY): Payer: Medicaid Other

## 2016-02-21 MED ORDER — IOPAMIDOL (ISOVUE-300) INJECTION 61%
INTRAVENOUS | Status: AC
  Administered 2016-02-21: 100 mL
  Filled 2016-02-21: qty 100

## 2016-02-21 MED ORDER — CEPHALEXIN 500 MG PO CAPS: 500.0000 mg | ORAL_CAPSULE | Freq: Two times a day (BID) | ORAL | Status: DC

## 2016-02-21 NOTE — Discharge Instructions (Signed)
Urinary Tract Infection Ashley Bates, your CT scan shows that you are constipation.  Take stool softeners at home to help with your pain.  Also take antibiotics for your urine infection.  See a primary care doctor within 3 days for close follow up. If symptoms worsen, come back to the ED immediately. Thank you.           .        .        .       3    .       . . alssayidat bikar, yuzhir alfahs almuqattaei alkhass bik 'annak al'iimsak. Javier Glazier Jonetta Speak alrraqayiq fi almanzil lilmusaeadat fi al'alm. 'aydaan aittikhadh almadadat alhayawiat lialtihab albawl alkhass bik. anzur tabib alrrieayat al'awwaliat fi ghdwn 3 'ayam limutabaeat ean kathb. 'iidha tafaqamat al'aerada, eud 'iilaa 'iid fawra. shukra.  A urinary tract infection (UTI) can occur any place along the urinary tract. The tract includes the kidneys, ureters, bladder, and urethra. A type of germ called bacteria often causes a UTI. UTIs are often helped with antibiotic medicine.  HOME CARE   If given, take antibiotics as told by your doctor. Finish them even if you start to feel better.  Drink enough fluids to keep your pee (urine) clear or pale yellow.  Avoid tea, drinks with caffeine, and bubbly (carbonated) drinks.  Pee often. Avoid holding your pee in for a long time.  Pee before and after having sex (intercourse).  Wipe from front to back after you poop (bowel movement) if you are a woman. Use each tissue only once. GET HELP RIGHT AWAY IF:   You have back pain.  You have lower belly (abdominal) pain.  You have chills.  You feel sick to your stomach (nauseous).  You throw up (vomit).  Your burning or discomfort with peeing does not go away.  You have a fever.  Your symptoms are not better in 3 days. MAKE SURE YOU:    Understand these instructions.  Will watch your condition.  Will get help right away if you are not doing well or get worse.   This information is not intended to replace advice given to you by your health care provider. Make sure you discuss any questions you have with your health care provider.   Document Released: 04/20/2008 Document Revised: 11/23/2014 Document Reviewed: 06/02/2012 Elsevier Interactive Patient Education [employer]

## 2016-02-21 NOTE — ED Notes (Signed)
MD at bedside. 

## 2016-02-21 NOTE — ED Provider Notes (Signed)
Patient was signed out pending CT scan. It shows constipation, likely the cause of the patient's pain is she is having left lower quadrant abdominal pain. Urine shows mild infection will treat with Keflex. Also CT scan shows possible pneumonia however on repeat evaluation, patient states she has no fever no cough and no symptoms of pneumonia. Blood pressure did drop to 80/50 overnight, this is when the patient was sleeping. I do not believe this represents any sort of sepsis or shock. She is alert awake oriented and able to converse with me via translator. She appears well in no acute distress, patient is safe for discharge.  Tomasita Crumble, MD 02/21/16 [phone]

## 2016-02-26 ENCOUNTER — Other Ambulatory Visit (HOSPITAL_COMMUNITY)
Admission: RE | Admit: 2016-02-26 | Discharge: 2016-02-26 | Disposition: A | Discharge status: Home / Self Care | Payer: Medicaid Other | Source: Ambulatory Visit | Attending: Family Medicine | Admitting: Family Medicine

## 2016-02-26 ENCOUNTER — Encounter: Payer: Self-pay | Admitting: Family Medicine

## 2016-02-26 ENCOUNTER — Ambulatory Visit (INDEPENDENT_AMBULATORY_CARE_PROVIDER_SITE_OTHER): Payer: Medicaid Other | Admitting: Family Medicine

## 2016-02-26 VITALS — BP 131/102 | HR 75 | Temp 97.9°F | Ht 64.0 in | Wt 166.8 lb

## 2016-02-26 DIAGNOSIS — N898 Other specified noninflammatory disorders of vagina: Secondary | ICD-10-CM | POA: Diagnosis not present

## 2016-02-26 DIAGNOSIS — R102 Pelvic and perineal pain: Secondary | ICD-10-CM | POA: Diagnosis not present

## 2016-02-26 DIAGNOSIS — Z113 Encounter for screening for infections with a predominantly sexual mode of transmission: Secondary | ICD-10-CM | POA: Insufficient documentation

## 2016-02-26 LAB — POCT WET PREP (WET MOUNT): CLUE CELLS WET PREP WHIFF POC: NEGATIVE

## 2016-02-26 MED ORDER — POLYETHYLENE GLYCOL 3350 17 GM/SCOOP PO POWD: 17.0000 g | Freq: Two times a day (BID) | ORAL | Status: DC | PRN

## 2016-02-26 NOTE — Patient Instructions (Addendum)
Checking ultrasound of your pelvic organs to see why you are in pain Take miralax 1-2 times daily Follow up here with Dr. Richarda Blade in 2 weeks  Be well, Dr. Pollie Meyer

## 2016-02-26 NOTE — Progress Notes (Signed)
Date of Visit: 02/26/2016   HPI:  Patient presents to discuss abdominal pain. Arabic interpreter utilized during this visit.   Reports has had pain in lower abdomen/pelvic area for about 1 week. Worse over the last 4 days. She was seen at Urgent Care on 4/6 and sent to the ED due to concern over possible ureteral stone, diverticulitis. In ED underwent CT abdomen/pelvis to evaluate for these etiologies. CT showed constipation in abdomen. Also incidentally noted bibasilar opacities with small pleural effusions, likely related to atelectasis. Patient had denied having any pulmonary symptoms at that time. She was started on keflex for a possible UTI. No urine culture was obtained. No pelvic exam was performed.  Continues to have pain in lower pelvic area. Feels as if her uterus is involved. Denies fever, dysuria, urinary issues, blood in stool. She is 7 years into menopause. Has a history of constipation in the past. Usually has bowel movement about every 3-4 days. Last had a bowel movement yesterday. Does note that she's had some thick white vaginal discharge. No vaginal bleeding.   ROS: See HPI.  PMFSH: history of glaucoma, rheumatoid arthritis on methotrexate, refugee from [place]  PHYSICAL EXAM: BP 131/102 mmHg  Pulse 75  Temp(Src) 97.9 F (36.6 C) (Oral)  Ht 5\' 4"  (1.626 m)  Wt 166 lb 12.8 oz (75.66 kg)  BMI 28.62 kg/m2 Gen: NAD pleasant coopeartive HEENT: normocephalic, atraumatic, moist mucous membranes, No anterior cervical or supraclavicular lymphadenopathy.  Heart: regular rate and rhythm, no murmur Lungs: clear to auscultation bilaterally, normal work of breathing Neuro: alert, grossly nonfocal, speech normal Ext: atraumatic Abdomen: some tenderness in lower abdomen. No peritoneal signs. No guarding or rigidity. Soft. No organomegaly GU: normal appearing external genitalia without lesions. Vagina is moist with white discharge. Cervix normal in appearance. Generalized tenderness with  bimanual exam, not specific for adnexal or cervical motion tenderness. No adnexal masses palpable.  ASSESSMENT/PLAN:  Pelvic pain in female Etiology not clear at this time. Warrants further workup. Suspect constipation is contributing to at least some degree. Other potential etiologies include cervicitis, PID, UTI (though doubt this as patient has no urinary symptoms). Unfortunately urine culture was not obtained by ED or urgent care. Will have patient finish out course of keflex. Plan: - start miralax for bowel regimen - pelvic ultrasound - wet prep, gc/chlamydia - follow up with PCP in 1-2 weeks   FOLLOW UP: Follow up in 1-2 weeks with PCP for above issues.  J. [place], MD [facility]

## 2016-02-27 ENCOUNTER — Telehealth: Payer: Self-pay | Admitting: Family Medicine

## 2016-02-27 LAB — CERVICOVAGINAL ANCILLARY ONLY
CHLAMYDIA, DNA PROBE: NEGATIVE
NEISSERIA GONORRHEA: NEGATIVE
TRICH (WINDOWPATH): NEGATIVE

## 2016-02-27 MED ORDER — FLUCONAZOLE 150 MG PO TABS: 150.0000 mg | ORAL_TABLET | Freq: Once | ORAL | Status: DC

## 2016-02-27 NOTE — Telephone Encounter (Signed)
Called patient using Arabic interpreter to discuss wet prep results, which showed yeast. Will treat with diflucan 150mg  once, repeat in 3 days if not better.  Also clarified dose of miralax with patient since she asked how much to take. One capful in morning.  Patient appreciative.  , MD

## 2016-02-29 DIAGNOSIS — R102 Pelvic and perineal pain: Secondary | ICD-10-CM | POA: Insufficient documentation

## 2016-02-29 NOTE — Assessment & Plan Note (Addendum)
Etiology not clear at this time. Warrants further workup. Suspect constipation is contributing to at least some degree. Other potential etiologies include cervicitis, PID, UTI (though doubt this as patient has no urinary symptoms). Unfortunately urine culture was not obtained by ED or urgent care. Will have patient finish out course of keflex. Plan: - start miralax for bowel regimen - pelvic ultrasound - wet prep, gc/chlamydia - follow up with PCP in 1-2 weeks

## 2016-03-04 ENCOUNTER — Ambulatory Visit (HOSPITAL_COMMUNITY)
Admission: RE | Admit: 2016-03-04 | Discharge: 2016-03-04 | Disposition: A | Discharge status: Home / Self Care | Payer: Medicaid Other | Source: Ambulatory Visit | Attending: Family Medicine | Admitting: Family Medicine

## 2016-03-04 DIAGNOSIS — R102 Pelvic and perineal pain: Secondary | ICD-10-CM | POA: Diagnosis present

## 2016-03-11 ENCOUNTER — Telehealth: Payer: Self-pay | Admitting: Family Medicine

## 2016-03-11 NOTE — Telephone Encounter (Signed)
Ashley Bates friend called to ask about results of [place].  Patient does not speak [place].  If info cannot be given to friend, then please have an interpreter call patient with results.

## 2016-03-12 NOTE — Telephone Encounter (Signed)
Please call patient (with arabic interpreter) to inform her of normal pelvic ultrasound. No abnormalities of her uterus or ovaries. If she is still having pain then she should make another appointment to discuss next steps. Thanks!

## 2016-03-13 NOTE — Telephone Encounter (Signed)
Pt informed using arabic interpretor menne [id number]. Marland Kitchen Ashley Bates Bruna Potter, CMA

## 2016-03-31 ENCOUNTER — Other Ambulatory Visit: Payer: Self-pay | Admitting: Family Medicine

## 2016-03-31 ENCOUNTER — Ambulatory Visit: Payer: Medicaid Other | Admitting: Family Medicine

## 2016-04-10 IMAGING — DX DG CHEST 2V
2 series · 2 of 2 positions shown · non-contrast
Comparison: None.

CLINICAL DATA: Medication screening for rheumatoid arthritis.

EXAM:
CHEST  2 VIEW

[chest pa]
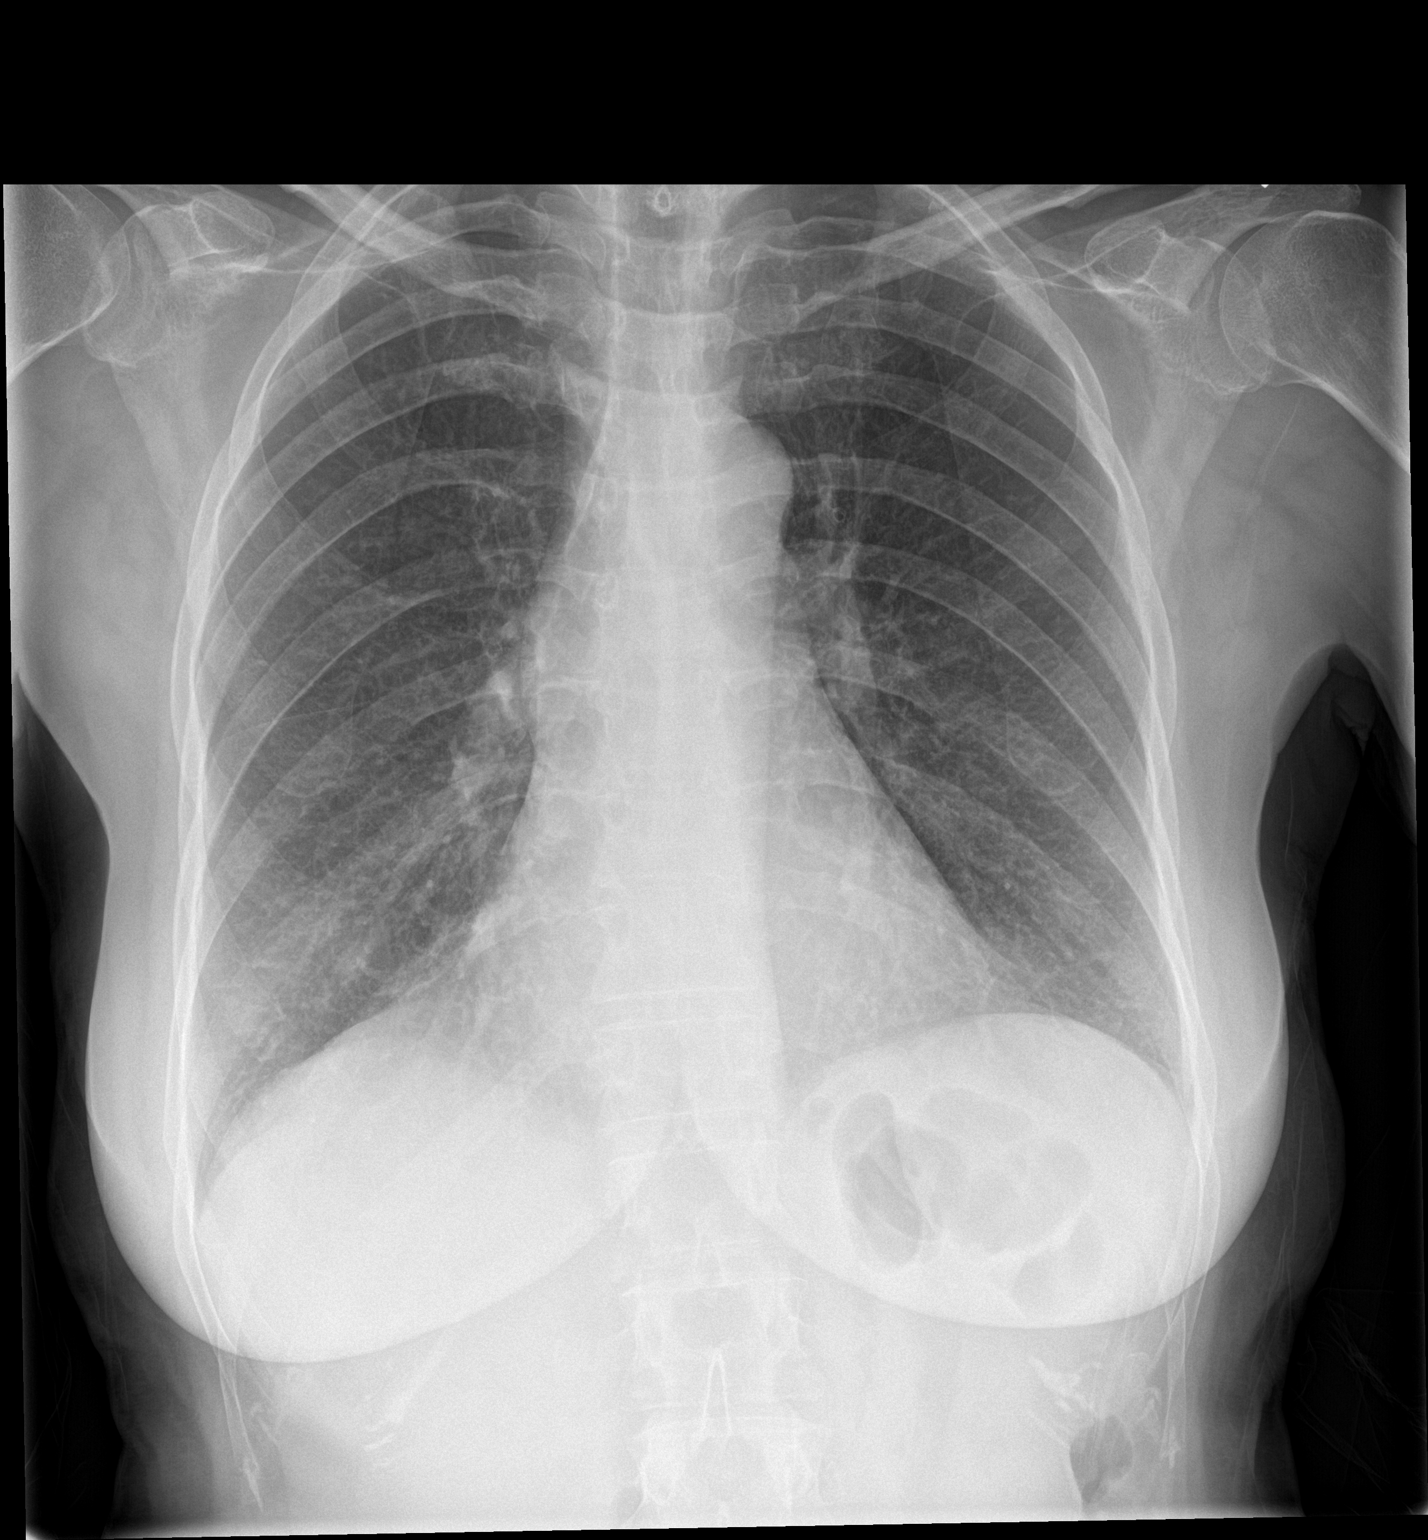

[chest lat]
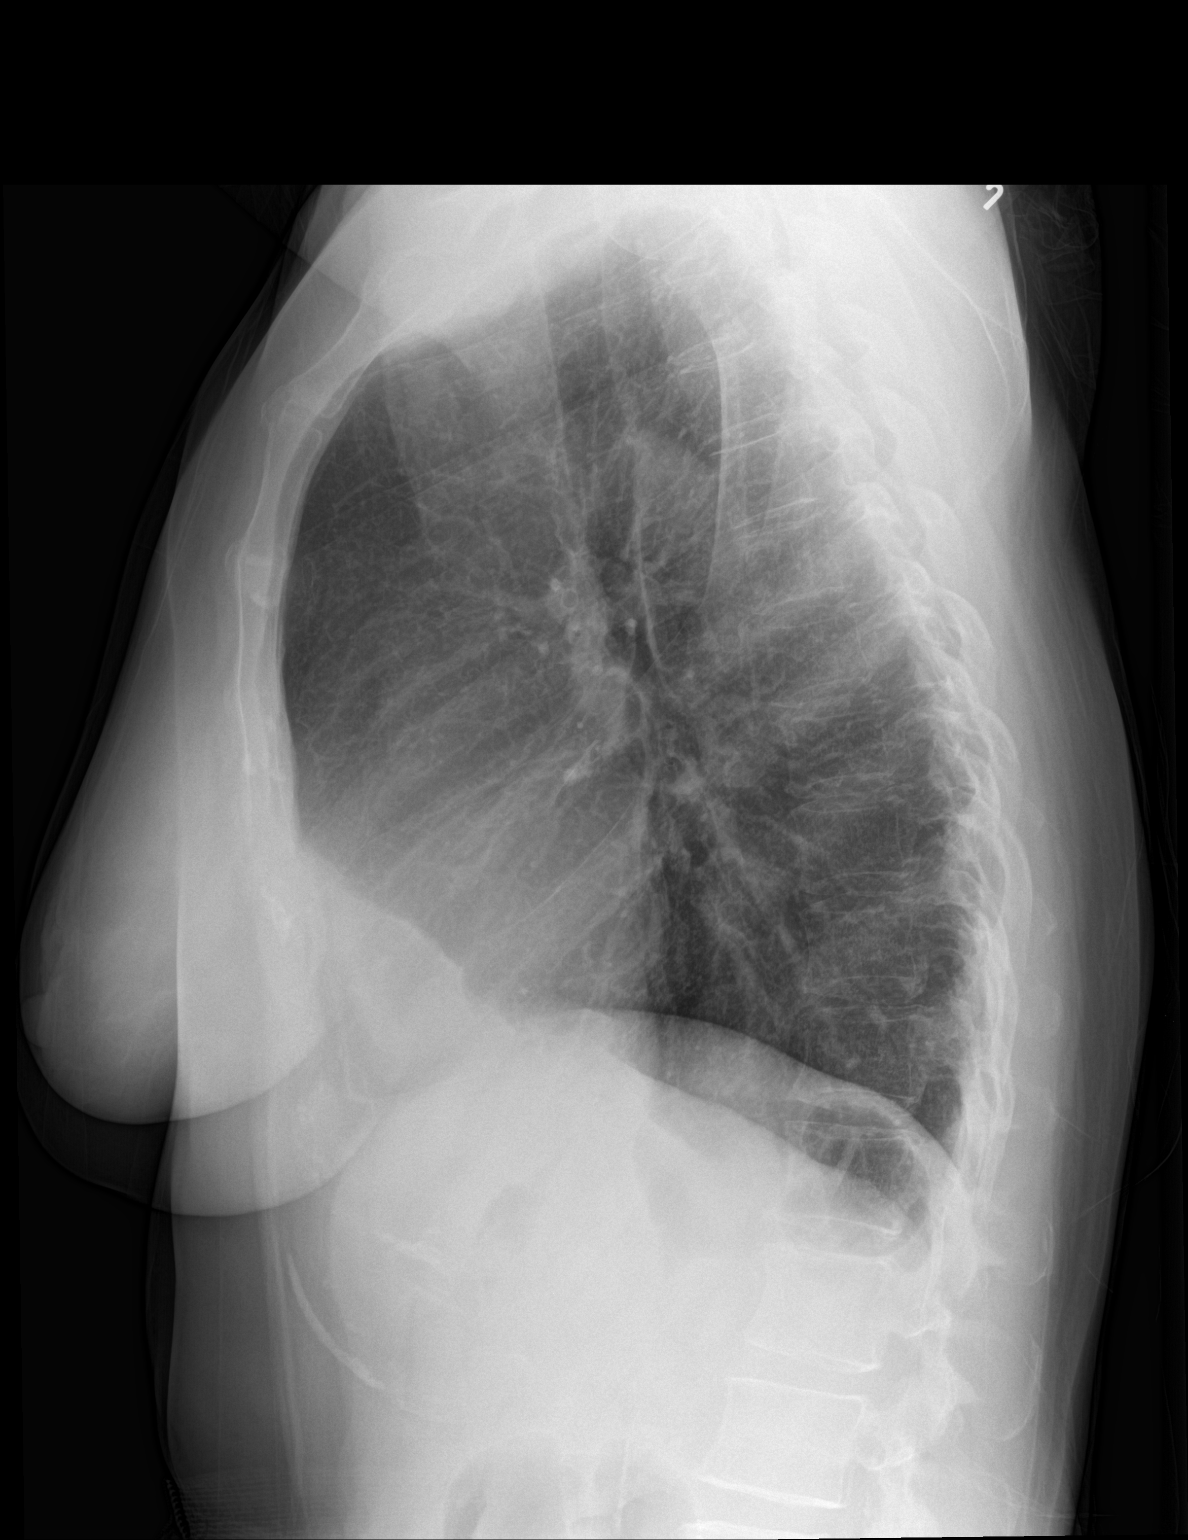

[2 of 2 positions shown; findings below may reference images not displayed]

FINDINGS: The cardiopericardial silhouette is borderline for projection. Mild
hyperinflation and basilar atelectasis is present, suggestive of
emphysema. Enlargement of the retrosternal clear space also
compatible with emphysema. No airspace disease. No pleural
effusions. Mediastinal contours are within normal limits.
IMPRESSION: Borderline heart size and emphysema without acute cardiopulmonary
disease.

## 2016-05-04 ENCOUNTER — Encounter (HOSPITAL_COMMUNITY): Payer: Self-pay | Admitting: Emergency Medicine

## 2016-05-04 ENCOUNTER — Ambulatory Visit (HOSPITAL_COMMUNITY)
Admission: EM | Admit: 2016-05-04 | Discharge: 2016-05-04 | Disposition: A | Discharge status: Home / Self Care | Payer: Medicaid Other | Attending: Family Medicine | Admitting: Family Medicine

## 2016-05-04 DIAGNOSIS — S161XXA Strain of muscle, fascia and tendon at neck level, initial encounter: Secondary | ICD-10-CM

## 2016-05-04 MED ORDER — CYCLOBENZAPRINE HCL 5 MG PO TABS: 5.0000 mg | ORAL_TABLET | Freq: Three times a day (TID) | ORAL | Status: DC | PRN

## 2016-05-04 MED ORDER — NAPROXEN 375 MG PO TABS: 375.0000 mg | ORAL_TABLET | Freq: Two times a day (BID) | ORAL | Status: DC

## 2016-05-04 MED ORDER — DICLOFENAC SODIUM 1 % TD GEL: 1.0000 "application " | Freq: Four times a day (QID) | TRANSDERMAL | Status: DC

## 2016-05-04 NOTE — Discharge Instructions (Signed)
Muscle Strain Apply the diclofenac gel to the area of soreness on the neck 4 times a day. Then  you may apply heat to that area and perform gentle stretches and massage. Take the prescribed medications for pain relief. It may cause some drowsiness. A muscle strain (pulled muscle) happens when a muscle is stretched beyond normal length. It happens when a sudden, violent force stretches your muscle too far. Usually, a few of the fibers in your muscle are torn. Muscle strain is common in athletes. Recovery usually takes 1-2 weeks. Complete healing takes 5-6 weeks.  HOME CARE   Follow the PRICE method of treatment to help your injury get better. Do this the first 2-3 days after the injury:  Protect. Protect the muscle to keep it from getting injured again.  Rest. Limit your activity and rest the injured body part.  Ice. Put ice in a plastic bag. Place a towel between your skin and the bag. Then, apply the ice and leave it on from 15-20 minutes each hour. After the third day, switch to moist heat packs.  Compression. Use a splint or elastic bandage on the injured area for comfort. Do not put it on too tightly.  Elevate. Keep the injured body part above the level of your heart.  Only take medicine as told by your doctor.  Warm up before doing exercise to prevent future muscle strains. GET HELP IF:   You have more pain or puffiness (swelling) in the injured area.  You feel numbness, tingling, or notice a loss of strength in the injured area. MAKE SURE YOU:   Understand these instructions.  Will watch your condition.  Will get help right away if you are not doing well or get worse.   This information is not intended to replace advice given to you by your health care provider. Make sure you discuss any questions you have with your health care provider.   Document Released: 08/11/2008 Document Revised: 08/23/2013 Document Reviewed: 06/01/2013 Elsevier Interactive Patient Education [employer].

## 2016-05-04 NOTE — ED Provider Notes (Signed)
CSN: [id number]     Arrival date & time 05/04/16  1911 History   First MD Initiated Contact with Patient 05/04/16 2206     Chief Complaint  Patient presents with  . Neck Pain   (Consider location/radiation/quality/duration/timing/severity/associated sxs/prior Treatment) HPI Comments: 55 year old Arabic speaking female is here with the relative interpreter complaining of pain to the right side of her neck. She apparently woke up from sleeping one morning and the pain was there. Does not remember any known injuries event. No fall or blunt trauma or other known injury. Pain is located to the right side of the neck along the scalene muscle as it inserts into the skull behind the ear. It does not involve the mastoid. Pain is worse with turning the head particular to the right. Seems to get worse with stiffness and pain upon awakening in the morning area denies focal paresthesias or weakness. Denies posterior neck pain.   Past Medical History  Diagnosis Date  . Back pain   . Scoliosis   . Spinal stenosis   . Post-surgical hypothyroidism   . Arthritis    Past Surgical History  Procedure Laterality Date  . Back surgery      x 3 (last in 2009)  . Lumbar disc surgery    . Carpal tunnel release      2007  . Thyroidectomy      2006   Family History  Problem Relation Age of Onset  . Diabetes Neg Hx   . Hyperlipidemia Neg Hx   . Hypertension Neg Hx   . Heart failure Neg Hx    Social History  Substance Use Topics  . Smoking status: Former Games developer  . Smokeless tobacco: None  . Alcohol Use: No   OB History    No data available     Review of Systems  Constitutional: Negative.   HENT: Negative.   Gastrointestinal: Negative.   Musculoskeletal: Positive for neck pain. Negative for back pain and neck stiffness.  Skin: Negative.   Neurological: Negative.   All other systems reviewed and are negative.   Allergies  Review of patient's allergies indicates no known allergies.  Home  Medications   Prior to Admission medications   Medication Sig Start Date End Date Taking? Authorizing Provider  cyclobenzaprine (FLEXERIL) 5 MG tablet Take 1 tablet (5 mg total) by mouth 3 (three) times daily as needed for muscle spasms. 05/04/16   Hayden Rasmussen, NP  diclofenac sodium (VOLTAREN) 1 % GEL Apply 1 application topically 4 (four) times daily. 05/04/16   Hayden Rasmussen, NP  folic acid (FOLVITE) 800 MCG tablet Take 800 mcg by mouth daily.    Historical Provider, MD  methotrexate (RHEUMATREX) 2.5 MG tablet Take 40 mg by mouth once a week. On Saturday 06/17/15   Historical Provider, MD  naproxen (NAPROSYN) 375 MG tablet Take 1 tablet (375 mg total) by mouth 2 (two) times daily. 05/04/16   Hayden Rasmussen, NP  polyethylene glycol powder (GLYCOLAX/MIRALAX) powder Take 17 g by mouth 2 (two) times daily as needed. 02/26/16   Latrelle Dodrill, MD  triamcinolone cream (KENALOG) 0.5 % APP TO NECK D 02/10/16   Historical Provider, MD   Meds Ordered and Administered this Visit  Medications - No data to display  BP 130/63 mmHg  Pulse 70  Temp(Src) 98.4 F (36.9 C) (Oral)  Resp 18  SpO2 99% No data found.   Physical Exam  Constitutional: She appears well-developed and well-nourished. No distress.  HENT:  Head: Normocephalic  and atraumatic.  Neck: Normal range of motion. Neck supple.  Tenderness to the right scalene muscle as it inserts to the right lateral parietal skull. The muscle can be traced inferiorly. No erythema or swelling. Rotation right and left is complete. No cervical spine tenderness. She is able to flex without pain. No tenderness over the mastoid.  Cardiovascular: Normal rate.   Pulmonary/Chest: Effort normal.  Lymphadenopathy:    She has no cervical adenopathy.  Neurological: She is alert. No cranial nerve deficit. She exhibits normal muscle tone.  Skin: Skin is warm and dry.  Psychiatric: She has a normal mood and affect.  Nursing note and vitals reviewed.   ED Course   Procedures (including critical care time)  Labs Review Labs Reviewed - No data to display  Imaging Review No results found.   Visual Acuity Review  Right Eye Distance:   Left Eye Distance:   Bilateral Distance:    Right Eye Near:   Left Eye Near:    Bilateral Near:         MDM   1. Cervical strain, acute, initial encounter    Apply the diclofenac gel to the area of soreness on the neck 4 times a day. Then  you may apply heat to that area and perform gentle stretches and massage. Take the prescribed medications for pain relief. It may cause some drowsiness. Meds ordered this encounter  Medications  . naproxen (NAPROSYN) 375 MG tablet    Sig: Take 1 tablet (375 mg total) by mouth 2 (two) times daily.    Dispense:  20 tablet    Refill:  0    Order Specific Question:  Supervising Provider    Answer:  Linna Hoff [phone]  . cyclobenzaprine (FLEXERIL) 5 MG tablet    Sig: Take 1 tablet (5 mg total) by mouth 3 (three) times daily as needed for muscle spasms.    Dispense:  30 tablet    Refill:  0    Order Specific Question:  Supervising Provider    Answer:  Linna Hoff [phone]  . diclofenac sodium (VOLTAREN) 1 % GEL    Sig: Apply 1 application topically 4 (four) times daily.    Dispense:  100 g    Refill:  0    Order Specific Question:  Supervising Provider    Answer:  Linna Hoff [[id number]]       Hayden Rasmussen, NP 05/04/16 [phone]

## 2016-05-04 NOTE — ED Notes (Signed)
Woke with neck pain, pain on right side of head and neck.  Onset 5 days ago, and has worsened over the five days

## 2016-05-07 ENCOUNTER — Ambulatory Visit: Payer: Medicaid Other | Admitting: Family Medicine

## 2016-07-21 LAB — CBC AND DIFFERENTIAL
HCT: 39 % (ref 36–46)
Hemoglobin: 12.7 g/dL (ref 12.0–16.0)
PLATELETS: 208 10*3/uL (ref 150–399)
WBC: 7.8 10*3/mL

## 2016-07-21 LAB — BASIC METABOLIC PANEL
BUN: 12 mg/dL (ref 4–21)
CREATININE: 0.8 mg/dL (ref 0.5–1.1)
GLUCOSE: 85 mg/dL
POTASSIUM: 4.9 mmol/L (ref 3.4–5.3)
Sodium: 141 mmol/L (ref 137–147)

## 2016-07-21 LAB — HEPATIC FUNCTION PANEL
ALT: 10 U/L (ref 7–35)
AST: 16 U/L (ref 13–35)
Alkaline Phosphatase: 54 U/L (ref 25–125)
Bilirubin, Total: 0.6 mg/dL

## 2016-08-22 ENCOUNTER — Other Ambulatory Visit: Payer: Self-pay | Admitting: Radiology

## 2016-08-22 DIAGNOSIS — Z79899 Other long term (current) drug therapy: Secondary | ICD-10-CM

## 2016-10-06 ENCOUNTER — Encounter: Payer: Self-pay | Admitting: *Deleted

## 2016-10-06 DIAGNOSIS — M47812 Spondylosis without myelopathy or radiculopathy, cervical region: Secondary | ICD-10-CM | POA: Insufficient documentation

## 2016-10-06 DIAGNOSIS — M5030 Other cervical disc degeneration, unspecified cervical region: Secondary | ICD-10-CM

## 2016-10-06 DIAGNOSIS — E559 Vitamin D deficiency, unspecified: Secondary | ICD-10-CM

## 2016-10-06 DIAGNOSIS — M51369 Other intervertebral disc degeneration, lumbar region without mention of lumbar back pain or lower extremity pain: Secondary | ICD-10-CM

## 2016-10-06 DIAGNOSIS — G5730 Lesion of lateral popliteal nerve, unspecified lower limb: Secondary | ICD-10-CM

## 2016-10-06 DIAGNOSIS — M5136 Other intervertebral disc degeneration, lumbar region: Secondary | ICD-10-CM

## 2016-10-06 HISTORY — DX: Other intervertebral disc degeneration, lumbar region without mention of lumbar back pain or lower extremity pain: M51.369

## 2016-10-06 HISTORY — DX: Vitamin D deficiency, unspecified: E55.9

## 2016-10-06 HISTORY — DX: Lesion of lateral popliteal nerve, unspecified lower limb: G57.30

## 2016-10-06 HISTORY — DX: Other intervertebral disc degeneration, lumbar region: M51.36

## 2016-10-06 HISTORY — DX: Other cervical disc degeneration, unspecified cervical region: M50.30

## 2016-10-07 ENCOUNTER — Ambulatory Visit (INDEPENDENT_AMBULATORY_CARE_PROVIDER_SITE_OTHER): Payer: Medicaid Other | Admitting: Rheumatology

## 2016-10-07 ENCOUNTER — Encounter: Payer: Self-pay | Admitting: Rheumatology

## 2016-10-07 VITALS — BP 111/76 | HR 62 | Resp 12 | Ht 67.0 in | Wt 150.0 lb

## 2016-10-07 DIAGNOSIS — M25511 Pain in right shoulder: Secondary | ICD-10-CM

## 2016-10-07 DIAGNOSIS — Z79899 Other long term (current) drug therapy: Secondary | ICD-10-CM

## 2016-10-07 DIAGNOSIS — G8929 Other chronic pain: Secondary | ICD-10-CM | POA: Diagnosis not present

## 2016-10-07 DIAGNOSIS — M25561 Pain in right knee: Secondary | ICD-10-CM

## 2016-10-07 DIAGNOSIS — M0579 Rheumatoid arthritis with rheumatoid factor of multiple sites without organ or systems involvement: Secondary | ICD-10-CM | POA: Diagnosis not present

## 2016-10-07 MED ORDER — FOLIC ACID 1 MG PO TABS: 2.0000 mg | ORAL_TABLET | Freq: Every day | ORAL | 4 refills | Status: DC

## 2016-10-07 MED ORDER — TRIAMCINOLONE ACETONIDE 40 MG/ML IJ SUSP
40.0000 mg | INTRAMUSCULAR | Status: AC | PRN
  Administered 2016-10-07: 40 mg via INTRA_ARTICULAR

## 2016-10-07 MED ORDER — LIDOCAINE HCL 1 % IJ SOLN
1.5000 mL | INTRAMUSCULAR | Status: AC | PRN
  Administered 2016-10-07: 1.5 mL

## 2016-10-07 NOTE — Progress Notes (Signed)
Office Visit Note  Patient: Ashley Bates             Date of Birth: 1961/09/19           MRN: [id number]             PCP: Jamelle Haring, MD Referring: Erlinda Hong* Visit Date: 10/07/2016 Occupation: @GUAROCC @    Subjective:  Follow-up (Patient states pain all over.) Accompanied by her son and 2 other female friends/relatives. Her son spoke [place]. He was able to translate for the patient. On the last visit she was accompanied by FAMILY FRIEND YOUSEF who translated really well.  History of Present Illness: [name] is a 55 y.o. female  Last seen in our office 06/01/2016 Patient is doing well with her rheumatoid arthritis on the last visit in July. At that time with the help of Donnetta Simpers) family friend; patient was not having any joint pain swelling and stiffness. There was no synovitis on examination back in July 2017.  Patient presents today because she's having pain all over according to her son.  Planning that she cannot use her right shoulder secondary to pain. She has decreased range of motion. She cannot comb her hair because of the right shoulder joint pain. No falls no injury for the right shoulder joint. Patient also Sr. her son who is interpreting that her left shoulder can hurt at times her right shoulder can hurt at times. It moves from shoulder to shoulder.    Activities of Daily Living:  Patient reports morning stiffness for 30 minutes.   Patient Reports nocturnal pain.  Difficulty dressing/grooming: Reports Difficulty climbing stairs: Reports Difficulty getting out of chair: Reports Difficulty using hands for taps, buttons, cutlery, and/or writing: Reports   Review of Systems  Constitutional: Negative for fatigue.  HENT: Negative for mouth sores and mouth dryness.   Eyes: Negative for dryness.  Respiratory: Negative for shortness of breath.   Gastrointestinal: Negative for constipation and diarrhea.  Musculoskeletal:  Negative for myalgias and myalgias.  Skin: Negative for sensitivity to sunlight.  Psychiatric/Behavioral: Negative for decreased concentration and sleep disturbance.    PMFS History:  Patient Active Problem List   Diagnosis Date Noted  . DDD (degenerative disc disease), cervical 10/06/2016  . DDD (degenerative disc disease), lumbar 10/06/2016  . 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 Vitamin D deficiency 10/06/2016  . Pelvic pain in female 02/29/2016  . Elevated TSH 07/16/2015  . Spinal stenosis of lumbar region 03/21/2015  . Left foot drop 03/07/2015  . Lumbago 03/07/2015  . Refugee health examination 03/07/2015  . Glaucoma 03/07/2015  . RA (rheumatoid arthritis) (HCC) 03/04/2015    Past Medical History:  Diagnosis Date  . Arthritis   . Back pain   . DDD (degenerative disc disease), cervical 10/06/2016  . DDD (degenerative disc disease), lumbar 10/06/2016  . 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 Post-surgical hypothyroidism   . Scoliosis   . Spinal stenosis   . Vitamin D deficiency 10/06/2016    Family History  Problem Relation Age of Onset  . Diabetes Neg Hx   . Hyperlipidemia Neg Hx   . Hypertension Neg Hx   . Heart failure Neg Hx    Past Surgical History:  Procedure Laterality Date  . BACK SURGERY     x 3 (last in 2009)  . CARPAL TUNNEL RELEASE     2007  . LUMBAR DISC SURGERY    . THYROIDECTOMY     2006  Social History   Social History Narrative   Immigrant Social History:   - Date arrived in [place]: 01/18/14   - Country of origin: [place]   - Location of refugee camp (if applicable), how long there, and what caused patient to leave home country?: No camp, was able to flee country straight to [place]   - Primary language: Arabic   -Requires intepreter (essentially speaks no [place])   - Tobacco/alcohol/drug use: denies   - Marriage Status: Married. With 1 son     Objective: Vital Signs: BP 111/76   Pulse 62   Resp 12   Ht 5\' 7"  (1.702 m)   Wt  150 lb (68 kg)   BMI 23.49 kg/m    Physical Exam  Constitutional: She is oriented to person, place, and time. She appears well-developed and well-nourished.  HENT:  Head: Normocephalic and atraumatic.  Eyes: EOM are normal. Pupils are equal, round, and reactive to light.  Cardiovascular: Normal rate, regular rhythm and normal heart sounds.  Exam reveals no gallop and no friction rub.   No murmur heard. Pulmonary/Chest: Effort normal and breath sounds normal. She has no wheezes. She has no rales.  Abdominal: Soft. Bowel sounds are normal. She exhibits no distension. There is no tenderness. There is no guarding. No hernia.  Musculoskeletal: Normal range of motion. She exhibits no edema, tenderness or deformity.  Lymphadenopathy:    She has no cervical adenopathy.  Neurological: She is alert and oriented to person, place, and time. Coordination normal.  Skin: Skin is warm and dry. Capillary refill takes less than 2 seconds. No rash noted.  Psychiatric: She has a normal mood and affect. Her behavior is normal.     Musculoskeletal Exam:  Decreased range of motion of bilateral shoulders are with 90 of abduction Grip strength is equal and strong bilaterally Fiber myalgia tender points are all absent  CDAI Exam: CDAI Homunculus Exam:   Tenderness:  RUE: glenohumeral Right hand: 2nd MCP and 3rd MCP Left hand: 2nd MCP and 3rd MCP RLE: tibiofemoral  Swelling:  Right hand: 2nd MCP and 3rd MCP Left hand: 2nd MCP and 3rd MCP  Joint Counts:  CDAI Tender Joint count: 6 CDAI Swollen Joint count: 4     Investigation: No additional findings. Labs from 07/21/2016 shows CMP with GFR is normal CBC with differential is normal And got these labs from cirrhosis. It is an SRS. She will be due for repeat labs in December 2017. She has standing order. [facility].  She can also come to our office to get the labs.  I've discussed this information with her son and  he is translating the information to his mother.  Imaging: No results found.  Speciality Comments: No specialty comments available.    Procedures:  Large Joint Inj Date/Time: 10/07/2016 4:46 PM Performed by: 10/09/2016 Authorized by: Tawni Pummel   Consent Given by:  Patient Site marked: the procedure site was marked   Timeout: prior to procedure the correct patient, procedure, and site was verified   Indications:  Pain Location:  Knee Site:  R knee Prep: patient was prepped and draped in usual sterile fashion   Needle Size:  27 G Needle Length:  1.5 inches Approach:  Medial Ultrasound Guidance: No   Fluoroscopic Guidance: No   Arthrogram: No   Medications:  1.5 mL lidocaine 1 %; 40 mg triamcinolone acetonide 40 MG/ML Aspiration Attempted: Yes   Patient tolerance:  Patient tolerated the procedure well with no immediate complications  Patient's right knee was injected with 40 mg of Kenalog mixed with one half mL to 1% lidocaine without epinephrine. Patient tolerated procedure well. There no complications. 5 minutes after the injection patient states that her. Pain was 0 on a scale of 0-10. We discussed Visco supplementation in the future.   Allergies: Patient has no known allergies.   Assessment / Plan:     Visit Diagnoses: Rheumatoid arthritis with rheumatoid factor of multiple sites without organ or systems involvement (HCC)  High risk medications (not anticoagulants) long-term use  Chronic right shoulder pain  Chronic pain of right knee   We discussed at length with the patient regarding her rheumatoid arthritis, old damage, new damage, proper use of methotrexate and folic acid, bilateral knee joint pain from osteoarthritis, shoulder joint pain/discomfort.  Patient states that her knee joint was more painful than her shoulder joint and so we elected to give her cortisone injection in her right knee joint. The one given on the visit of March 2017 had served her  well until 2 weeks ago. After informed consent was obtained, the site was prepped in usual sterile fashion, injected with 40 mg of Kenalog mixed with 1.5 mL of 1% lidocaine. Patient tolerated procedure well. There was no complication.  On next visit in February, I will discussed Visco supplementation with them.  I advised the patient to have translators with her at the next visit and each visit.  Orders: Orders Placed This Encounter  Procedures  . Large Joint Injection/Arthrocentesis   No orders of the defined types were placed in this encounter.   Face-to-face time spent with patient was 40 minutes. 50% of time was spent in counseling and coordination of care.  Follow-Up Instructions: Return in about 3 months (around 01/07/2017) for RA, HRRX, BILATERAL KJ PAIN, BILATERAL SJ PAIN.   Tawni Pummel, PA-C

## 2016-10-28 DIAGNOSIS — R768 Other specified abnormal immunological findings in serum: Secondary | ICD-10-CM | POA: Insufficient documentation

## 2016-10-28 NOTE — Progress Notes (Deleted)
Office Visit Note  Patient: Ashley Bates             Date of Birth: 06/27/1961           MRN: [id number]             PCP: Jamelle Haring, MD Referring: Erlinda Hong* Visit Date: 11/02/2016 Occupation: [place] refugee    Subjective:  No chief complaint on file.   History of Present Illness: Ashley Bates is a 55 y.o. female ***   Activities of Daily Living:  Patient reports morning stiffness for *** {minute/hour:19697}.   Patient {ACTIONS;DENIES/REPORTS:21021675::"Denies"} nocturnal pain.  Difficulty dressing/grooming: {ACTIONS;DENIES/REPORTS:21021675::"Denies"} Difficulty climbing stairs: {ACTIONS;DENIES/REPORTS:21021675::"Denies"} Difficulty getting out of chair: {ACTIONS;DENIES/REPORTS:21021675::"Denies"} Difficulty using hands for taps, buttons, cutlery, and/or writing: {ACTIONS;DENIES/REPORTS:21021675::"Denies"}   No Rheumatology ROS completed.   PMFS History:  Patient Active Problem List   Diagnosis Date Noted  . DDD (degenerative disc disease), cervical 10/06/2016  . DDD (degenerative disc disease), lumbar 10/06/2016  . 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 Vitamin D deficiency 10/06/2016  . Pelvic pain in female 02/29/2016  . Elevated TSH 07/16/2015  . Spinal stenosis of lumbar region 03/21/2015  . Left foot drop 03/07/2015  . Lumbago 03/07/2015  . Refugee health examination 03/07/2015  . Glaucoma 03/07/2015  . RA (rheumatoid arthritis) (HCC) 03/04/2015    Past Medical History:  Diagnosis Date  . Arthritis   . Back pain   . DDD (degenerative disc disease), cervical 10/06/2016  . DDD (degenerative disc disease), lumbar 10/06/2016  . 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 Post-surgical hypothyroidism   . Scoliosis   . Spinal stenosis   . Vitamin D deficiency 10/06/2016    Family History  Problem Relation Age of Onset  . Diabetes Neg Hx   . Hyperlipidemia Neg Hx   . Hypertension Neg Hx   . Heart failure Neg Hx    Past  Surgical History:  Procedure Laterality Date  . BACK SURGERY     x 3 (last in 2009)  . CARPAL TUNNEL RELEASE     2007  . LUMBAR DISC SURGERY    . THYROIDECTOMY     2006   Social History   Social History Narrative   Immigrant Social History:   - Date arrived in [place]: 01/18/14   - Country of origin: [place]   - Location of refugee camp (if applicable), how long there, and what caused patient to leave home country?: No camp, was able to flee country straight to [place]   - Primary language: Arabic   -Requires intepreter (essentially speaks no [place])   - Tobacco/alcohol/drug use: denies   - Marriage Status: Married. With 1 son     Objective: Vital Signs: There were no vitals taken for this visit.   Physical Exam   Musculoskeletal Exam: ***  CDAI Exam: No CDAI exam completed.    Investigation: Findings:   June 2016:  Her TSH was 4.7, which was high.  ANA was positive with negative titers.  CCP was more than 300.  Vitamin D was 29, which was low.  Her CK, hep panel, HIV, immunoglobulins, SPEP, and TB Gold were all within normal limits.  Her labs from June 2016 showed a sed rate of 128   08/26/16 CBC and CMP with GFR normal     Imaging: No results found.  Speciality Comments: No specialty comments available.    Procedures:  No procedures performed Allergies: Patient has no known allergies.   Assessment /  Plan:     Visit Diagnoses: Rheumatoid arthritis involving multiple sites with positive rheumatoid factor (HCC) - +RF +CCP   High risk medication use  DJD (degenerative joint disease), cervical  Spondylosis of lumbar region without myelopathy or radiculopathy  Vitamin D deficiency    Orders: No orders of the defined types were placed in this encounter.  No orders of the defined types were placed in this encounter.   Face-to-face time spent with patient was *** minutes. 50% of time was spent in counseling and coordination of care.  Follow-Up  Instructions: No Follow-up on file.   Jarreau Callanan, RT

## 2016-10-30 DIAGNOSIS — G629 Polyneuropathy, unspecified: Secondary | ICD-10-CM | POA: Insufficient documentation

## 2016-10-30 DIAGNOSIS — Z79899 Other long term (current) drug therapy: Secondary | ICD-10-CM | POA: Insufficient documentation

## 2016-10-30 DIAGNOSIS — M47816 Spondylosis without myelopathy or radiculopathy, lumbar region: Secondary | ICD-10-CM | POA: Insufficient documentation

## 2016-11-02 ENCOUNTER — Ambulatory Visit: Payer: Medicaid Other | Admitting: Rheumatology

## 2016-11-24 ENCOUNTER — Other Ambulatory Visit: Payer: Self-pay | Admitting: *Deleted

## 2016-11-24 DIAGNOSIS — Z79899 Other long term (current) drug therapy: Secondary | ICD-10-CM

## 2016-11-24 LAB — CBC WITH DIFFERENTIAL/PLATELET
BASOS ABS: 82 {cells}/uL (ref 0–200)
BASOS PCT: 1 %
EOS ABS: 82 {cells}/uL (ref 15–500)
Eosinophils Relative: 1 %
HEMATOCRIT: 38.6 % (ref 35.0–45.0)
HEMOGLOBIN: 12.7 g/dL (ref 11.7–15.5)
LYMPHS ABS: 3034 {cells}/uL (ref 850–3900)
Lymphocytes Relative: 37 %
MCH: 30 pg (ref 27.0–33.0)
MCHC: 32.9 g/dL (ref 32.0–36.0)
MCV: 91 fL (ref 80.0–100.0)
MONO ABS: 410 {cells}/uL (ref 200–950)
MPV: 11.7 fL (ref 7.5–12.5)
Monocytes Relative: 5 %
NEUTROS ABS: 4592 {cells}/uL (ref 1500–7800)
Neutrophils Relative %: 56 %
PLATELETS: 250 10*3/uL (ref 140–400)
RBC: 4.24 MIL/uL (ref 3.80–5.10)
RDW: 14.3 % (ref 11.0–15.0)
WBC: 8.2 10*3/uL (ref 3.8–10.8)

## 2016-11-24 LAB — COMPLETE METABOLIC PANEL WITH GFR
ALT: 10 U/L (ref 6–29)
AST: 16 U/L (ref 10–35)
Albumin: 4.1 g/dL (ref 3.6–5.1)
Alkaline Phosphatase: 62 U/L (ref 33–130)
BUN: 13 mg/dL (ref 7–25)
CHLORIDE: 104 mmol/L (ref 98–110)
CO2: 24 mmol/L (ref 20–31)
Calcium: 9 mg/dL (ref 8.6–10.4)
Creat: 0.74 mg/dL (ref 0.50–1.05)
GLUCOSE: 90 mg/dL (ref 65–99)
Potassium: 4.3 mmol/L (ref 3.5–5.3)
SODIUM: 139 mmol/L (ref 135–146)
TOTAL PROTEIN: 7.4 g/dL (ref 6.1–8.1)
Total Bilirubin: 0.4 mg/dL (ref 0.2–1.2)

## 2016-11-25 ENCOUNTER — Telehealth: Payer: Self-pay | Admitting: *Deleted

## 2016-11-25 MED ORDER — METHOTREXATE 2.5 MG PO TABS: 20.0000 mg | ORAL_TABLET | ORAL | 0 refills | Status: DC

## 2016-11-25 MED ORDER — FOLIC ACID 1 MG PO TABS: 2.0000 mg | ORAL_TABLET | Freq: Every day | ORAL | 4 refills | Status: DC

## 2016-11-25 NOTE — Telephone Encounter (Signed)
Prescriptions sent to the pharmacy.  

## 2016-11-25 NOTE — Progress Notes (Signed)
Labs normal.

## 2016-11-25 NOTE — Telephone Encounter (Signed)
-----   Message from Tawni Pummel, [place] sent at 11/24/2016  4:33 PM EST ----- Patient came in today for blood draw.  Once the labs are resulted, we can refill her methotrexate and folic acid. Her graft she is due for a follow-up visit for every 22nd 2018.

## 2016-11-27 ENCOUNTER — Ambulatory Visit: Payer: Medicaid Other | Admitting: Family Medicine

## 2016-12-09 ENCOUNTER — Telehealth: Payer: Self-pay | Admitting: Rheumatology

## 2016-12-09 MED ORDER — FOLIC ACID 1 MG PO TABS: 2.0000 mg | ORAL_TABLET | Freq: Every day | ORAL | 4 refills | Status: DC

## 2016-12-09 MED ORDER — METHOTREXATE 2.5 MG PO TABS: 20.0000 mg | ORAL_TABLET | ORAL | 0 refills | Status: DC

## 2016-12-09 NOTE — Telephone Encounter (Signed)
Prescriptions were approved to be sent in on 11/25/16. Prescriptions resent to the pharmacy requested by patient. Called [name] to advise prescriptions have been sent.

## 2016-12-09 NOTE — Telephone Encounter (Signed)
Patient was denied for Medicaid so they were trying to find an inexpensive way to get the medications and patient is requesting the MTX and folic acid be sent to [facility] pharmacy. They are requesting this be done asap and please call [name] after they have been sent.

## 2016-12-24 ENCOUNTER — Telehealth (INDEPENDENT_AMBULATORY_CARE_PROVIDER_SITE_OTHER): Payer: Self-pay | Admitting: Rheumatology

## 2016-12-24 MED ORDER — PREDNISONE 5 MG PO TABS: ORAL_TABLET | ORAL | 0 refills | Status: DC

## 2016-12-24 MED ORDER — METHOTREXATE 2.5 MG PO TABS: 20.0000 mg | ORAL_TABLET | ORAL | 2 refills | Status: DC

## 2016-12-24 MED ORDER — FOLIC ACID 1 MG PO TABS: 2.0000 mg | ORAL_TABLET | Freq: Every day | ORAL | 11 refills | Status: DC

## 2016-12-24 NOTE — Telephone Encounter (Signed)
Patient has been having trouble affording her prescriptions. Patient has went to apply for Medicaid and should know whether she is approved within 30 days. [name] her interpreter states a pharmacist informed them her Methotrexate was going to cost $1700 which is not affordable. I walked [name] though how to print coupons from goodrx.com for the patient's prescriptions so she may pick them up at a more affordable price. Patient has not had her medication in approximately 5 weeks and is having pain. They are requesting all medications be sent to [facility].

## 2016-12-24 NOTE — Telephone Encounter (Signed)
4po qAM x 5 days;3po qAM x 5 days,2po qAM x 5 days; 1po qAM x 5 days;1/2po qAM x 5 days;then stop. disp: 53 tablets w/ no refills.

## 2016-12-24 NOTE — Telephone Encounter (Signed)
Patient's interpretor called she  is requesting an script methotrexate, says patient does not have insurance and is paying out of pocket she has not been able to find a pharmacy she can afford. Patient has not had this in 5 weeks , she is having trouble sleeping due to pain.   Cb#: [phone] or [phone]

## 2016-12-24 NOTE — Telephone Encounter (Signed)
Left message with [name] to advise prescription for prednisone has been sent to the pharmacy. Advised to call back if she has any question on how to get the goodrx coupon.

## 2017-01-08 ENCOUNTER — Telehealth: Payer: Self-pay | Admitting: Rheumatology

## 2017-01-08 NOTE — Telephone Encounter (Signed)
[name] advised the lab work is due in April 2018

## 2017-01-08 NOTE — Telephone Encounter (Signed)
Patient is wondering what type of blood work Mr. Ashley Bates is wanting her to have and when does she need to get it done.

## 2017-01-12 ENCOUNTER — Ambulatory Visit: Payer: Medicaid Other | Admitting: Pharmacy Technician

## 2017-01-12 NOTE — Progress Notes (Signed)
Patient scheduled for eligibility appointment at [facility].  Patient did not show for the appointment on 01/12/17 at 10:30a.m.  Patient did not reschedule eligibility appointment.  [facility] will be unable to provide ongoing medication assistance until eligibility is determined.  Sherilyn Dacosta Care Manager [facility]

## 2017-01-15 ENCOUNTER — Ambulatory Visit: Payer: Medicaid Other

## 2017-01-25 ENCOUNTER — Ambulatory Visit: Payer: Medicaid Other | Admitting: Pharmacy Technician

## 2017-01-25 ENCOUNTER — Telehealth: Payer: Self-pay | Admitting: Pharmacy Technician

## 2017-01-25 NOTE — Telephone Encounter (Signed)
Phone call translation provided in Arabic by Stanford Breed [phone] at [facility].  Spoke with Wess Botts and cancelled face-to-face Arabic Interpreter scheduled for eligibility appt on 01/25/17 at 3:30p.m.  Sherilyn Dacosta Care Manager [facility]

## 2017-01-25 NOTE — Progress Notes (Signed)
Patient scheduled for eligibility appointment at [facility] on 01/25/17 at 3:30p.m.  Patient called to cancel appt.  Patient stated that she now has Medicaid that became effective 01/14/17.  Sherilyn Dacosta Care Manager [facility]

## 2017-01-30 IMAGING — US US PELVIS COMPLETE
1 series · 15 of 25 positions shown · non-contrast
Comparison: CT 02/21/2016

CLINICAL DATA: Low midline pain for 2 weeks.



[Series 1: us pelvis complete · 15 of 44 slices shown]
[im 1/44]
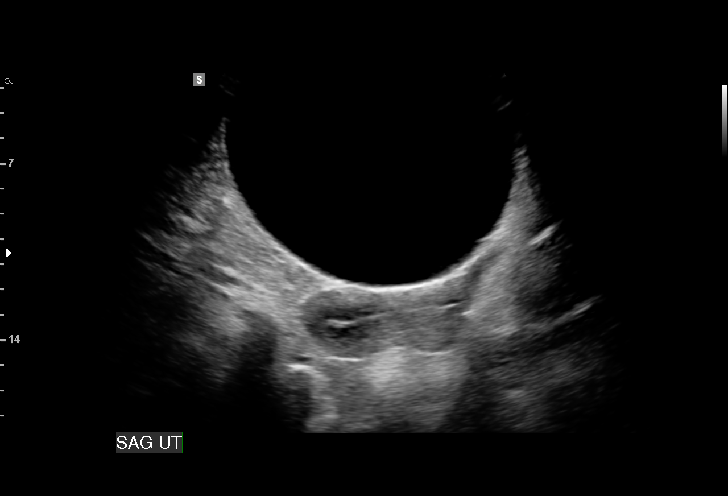
[im 4/44]
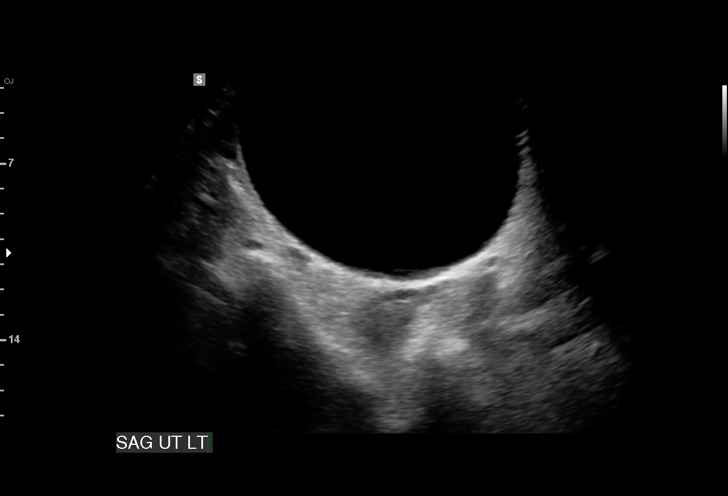
[im 8/44]
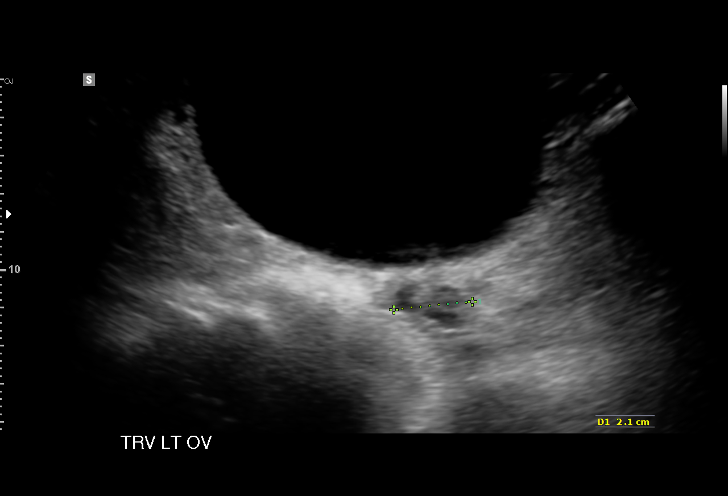
[im 9/44]
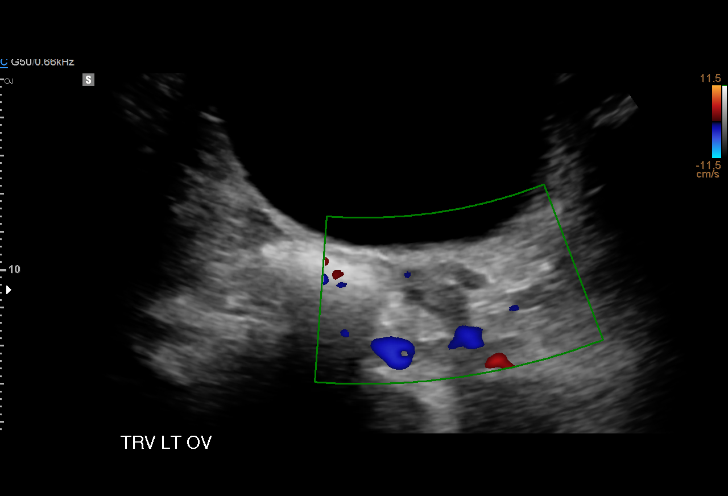
[im 13/44]
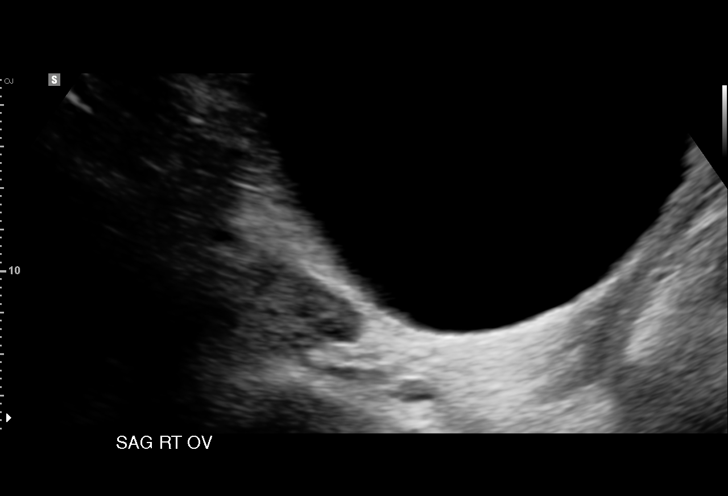
[im 17/44]
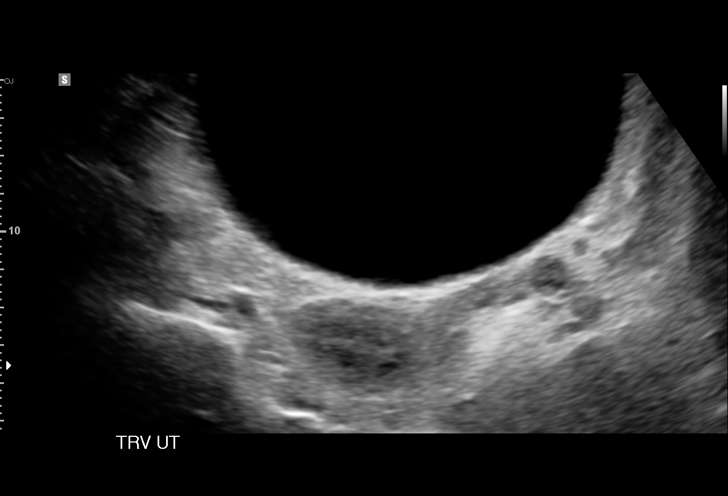
[im 18/44]
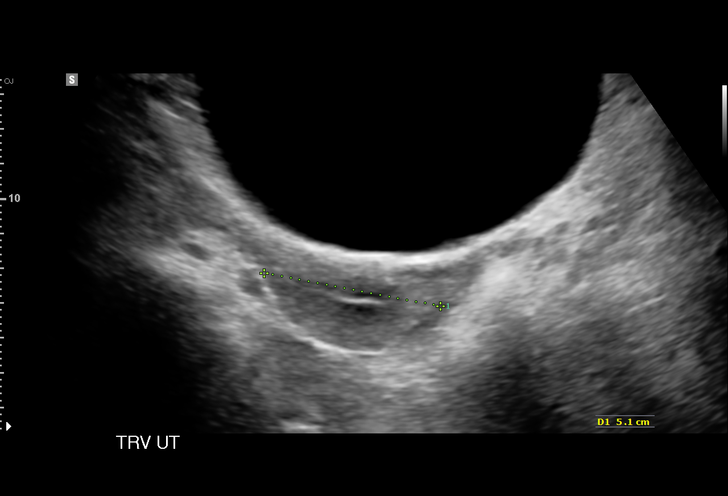
[im 22/44]
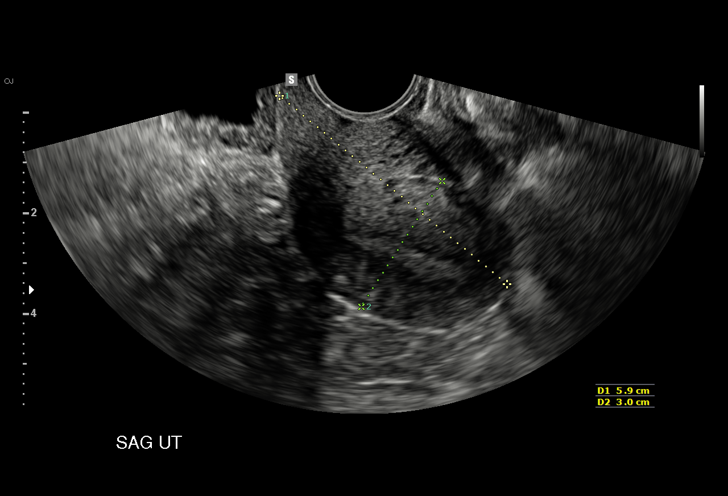
[im 26/44]
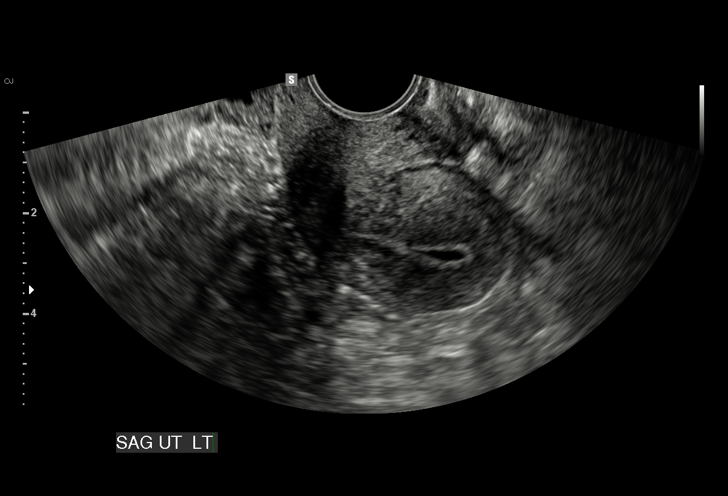
[im 27/44]
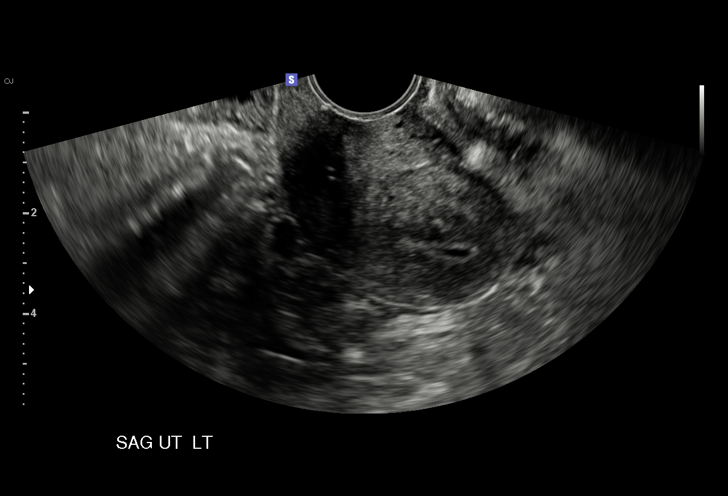
[im 31/44]
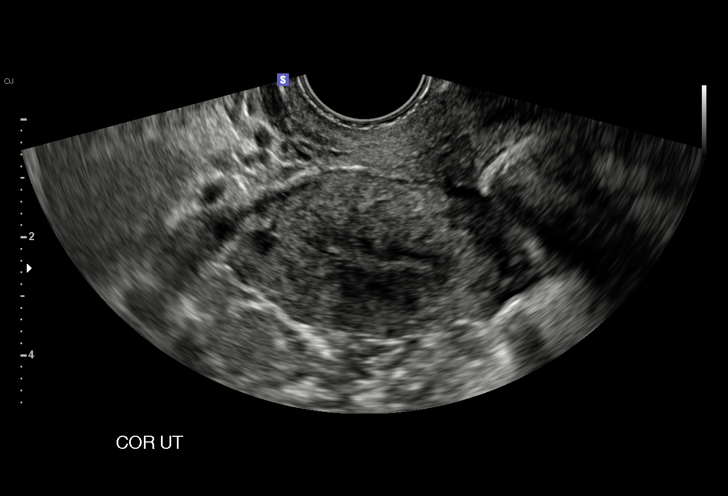
[im 35/44]
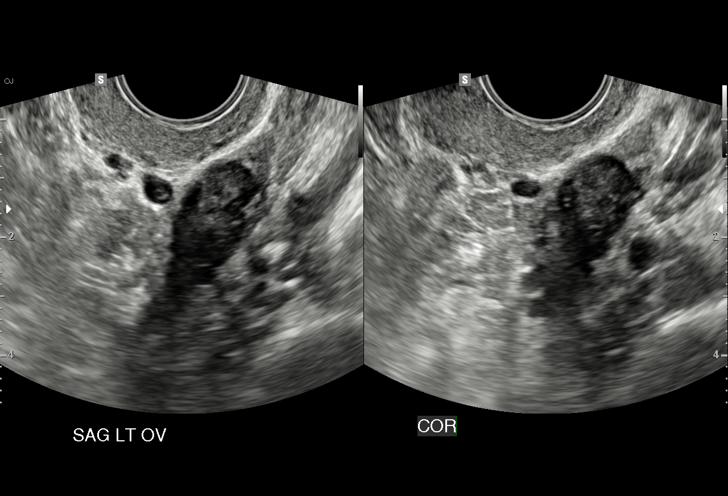
[im 36/44]
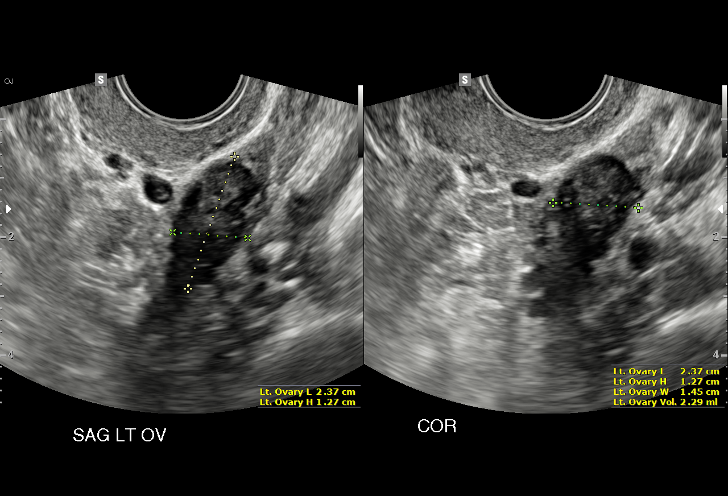
[im 40/44]
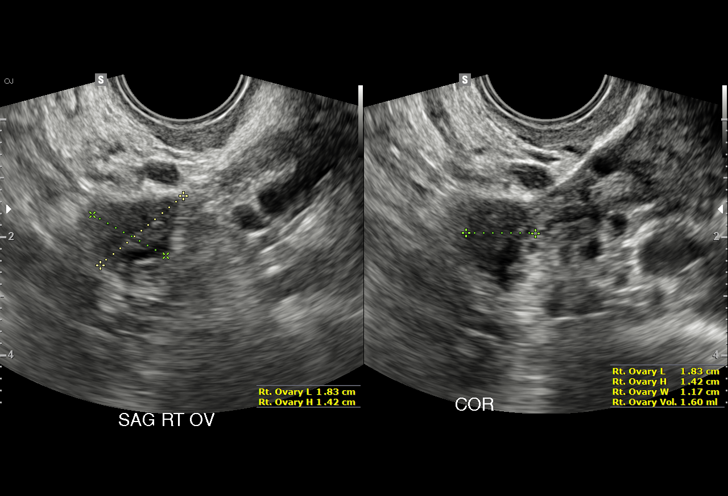
[im 44/44]
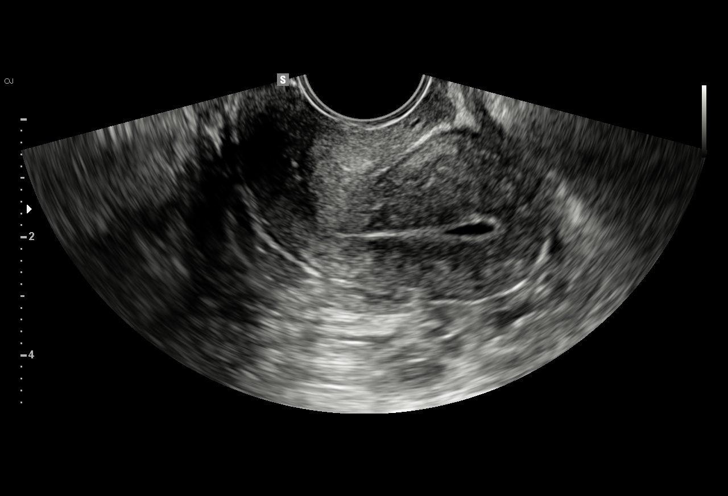

[15 of 25 positions shown; findings below may reference images not displayed]

FINDINGS: Uterus

Measurements: Normal in size and echotexture measuring 6.7 x 2.7 x
5.1 cm.. No fibroids or other mass visualized.

Endometrium

Thickness: Upper limits of normal thickness for postmenopausal
female at 5 mm. Small amount fluid in the fundal portion of the
canal..

Right ovary

Measurements: Normal in size measuring 1.8 x 1.4 x 1.2 cm..

Left ovary

Measurements: Normal size measuring 2.4 x 1.3 x 1.5 cm..

Other findings

No abnormal free fluid.
IMPRESSION: Normal ovaries and uterus for age.

## 2017-02-23 ENCOUNTER — Other Ambulatory Visit: Payer: Self-pay | Admitting: *Deleted

## 2017-02-23 DIAGNOSIS — Z79899 Other long term (current) drug therapy: Secondary | ICD-10-CM

## 2017-02-23 LAB — COMPLETE METABOLIC PANEL WITH GFR
ALBUMIN: 4.2 g/dL (ref 3.6–5.1)
ALT: 10 U/L (ref 6–29)
AST: 16 U/L (ref 10–35)
Alkaline Phosphatase: 58 U/L (ref 33–130)
BUN: 12 mg/dL (ref 7–25)
CHLORIDE: 107 mmol/L (ref 98–110)
CO2: 28 mmol/L (ref 20–31)
CREATININE: 0.86 mg/dL (ref 0.50–1.05)
Calcium: 9.1 mg/dL (ref 8.6–10.4)
GFR, Est African American: 87 mL/min (ref >=60)
GFR, Est Non African American: 75 mL/min (ref >=60)
GLUCOSE: 73 mg/dL (ref 65–99)
Potassium: 5 mmol/L (ref 3.5–5.3)
SODIUM: 142 mmol/L (ref 135–146)
Total Bilirubin: 0.5 mg/dL (ref 0.2–1.2)
Total Protein: 7 g/dL (ref 6.1–8.1)

## 2017-02-23 LAB — CBC WITH DIFFERENTIAL/PLATELET
BASOS ABS: 83 {cells}/uL (ref 0–200)
Basophils Relative: 1 %
Eosinophils Absolute: 83 cells/uL (ref 15–500)
Eosinophils Relative: 1 %
HEMATOCRIT: 38 % (ref 35.0–45.0)
HEMOGLOBIN: 12.7 g/dL (ref 11.7–15.5)
LYMPHS ABS: 2573 {cells}/uL (ref 850–3900)
Lymphocytes Relative: 31 %
MCH: 30 pg (ref 27.0–33.0)
MCHC: 33.4 g/dL (ref 32.0–36.0)
MCV: 89.8 fL (ref 80.0–100.0)
MONO ABS: 498 {cells}/uL (ref 200–950)
MPV: 11.8 fL (ref 7.5–12.5)
Monocytes Relative: 6 %
NEUTROS PCT: 61 %
Neutro Abs: 5063 cells/uL (ref 1500–7800)
Platelets: 220 10*3/uL (ref 140–400)
RBC: 4.23 MIL/uL (ref 3.80–5.10)
RDW: 14.3 % (ref 11.0–15.0)
WBC: 8.3 10*3/uL (ref 3.8–10.8)

## 2017-02-23 NOTE — Progress Notes (Signed)
WNL

## 2017-03-03 ENCOUNTER — Telehealth: Payer: Self-pay | Admitting: Internal Medicine

## 2017-03-03 DIAGNOSIS — Z1239 Encounter for other screening for malignant neoplasm of breast: Secondary | ICD-10-CM

## 2017-03-03 NOTE — Telephone Encounter (Signed)
Pt would like a referral to get her mammogram done. ep

## 2017-03-04 NOTE — Telephone Encounter (Signed)
Typically patients do not need a referral for mammography. I placed an order for mammography just in case. Please let patient know she should be able to just [facility] for an appointment and provide number. Thank you.

## 2017-03-09 NOTE — Telephone Encounter (Signed)
Attempted to call pt no voicemail set up. Will try again later. Attikus Bartoszek, CMA  

## 2017-03-11 NOTE — Telephone Encounter (Signed)
Called patient, no answer, no voicemail

## 2017-03-25 ENCOUNTER — Ambulatory Visit (INDEPENDENT_AMBULATORY_CARE_PROVIDER_SITE_OTHER): Payer: Medicaid Other | Admitting: Family Medicine

## 2017-03-25 ENCOUNTER — Encounter: Payer: Self-pay | Admitting: Family Medicine

## 2017-03-25 VITALS — BP 102/71 | HR 84 | Temp 98.3°F | Wt 162.0 lb

## 2017-03-25 DIAGNOSIS — Z79899 Other long term (current) drug therapy: Secondary | ICD-10-CM | POA: Diagnosis not present

## 2017-03-25 DIAGNOSIS — R946 Abnormal results of thyroid function studies: Secondary | ICD-10-CM

## 2017-03-25 DIAGNOSIS — Z789 Other specified health status: Secondary | ICD-10-CM

## 2017-03-25 DIAGNOSIS — M47812 Spondylosis without myelopathy or radiculopathy, cervical region: Secondary | ICD-10-CM

## 2017-03-25 DIAGNOSIS — M542 Cervicalgia: Secondary | ICD-10-CM | POA: Diagnosis not present

## 2017-03-25 DIAGNOSIS — R7989 Other specified abnormal findings of blood chemistry: Secondary | ICD-10-CM

## 2017-03-25 DIAGNOSIS — M5030 Other cervical disc degeneration, unspecified cervical region: Secondary | ICD-10-CM | POA: Diagnosis not present

## 2017-03-25 NOTE — Patient Instructions (Signed)
Follow up with Dr Sampson Goon on 5/21.  She will go over your labs and xrays.

## 2017-03-25 NOTE — Progress Notes (Signed)
Subjective: CC: neck pain, difficulty swallowing [id number] Ashley Bates is a 56 y.o. female presenting to clinic today for same day appointment. 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  She has known RA as well.  She reports that neck pain is chronic but seemed to get worse about 4 days ago.  She denies preceding injury, fevers, chills.  She reports associated malaise, headache and flushing over the weekend.  She attributes this to an extra dose of Methotrexate on Saturday.  She had forgotten she'd already taken it on Friday.  She reported that she immediately felt nauseous and dizzy at that time but that has since resolved.  She denies weakness or numbness in UE.  She has not taken anything for symptoms.  No NSAIDs.  Additionally, she notes difficulty swallowing some times.  She reports sometimes water is difficult to get down.  Again, denies nausea, regurgitation, diarrhea, voice changes.  She reports chronic constipation.  She has a history of elevated TSH in 2016.  She reports she never received a call regarding these labs so they have not been followed up on.  Denies neck masses.   No Known Allergies  Social Hx reviewed: non smoker. MedHx, current medications and allergies reviewed.  Please see EMR. ROS: Per HPI  Objective: Office vital signs reviewed. BP 102/71   Pulse 84   Temp 98.3 F (36.8 C) (Oral)   Wt 162 lb (73.5 kg)   SpO2 96%   BMI 25.37 kg/m   Physical Examination:  General: Awake, alert, well nourished, nontoxic, No acute distress HEENT: Normal    Neck: No masses palpated. No lymphadenopathy; no thyroid nodules or palpable enlargement.    Eyes: PERRLA, EOMI, sclera white    Nose: nasal turbinates moist, no nasal discharge    Throat: moist mucus membranes, no erythema, no tonsillar exudate.  Airway is patent Cardio:  regular rate and rhythm, S1S2 heard, no murmurs appreciated Pulm: clear to auscultation bilaterally, no wheezes, rhonchi or rales; normal work of breathing on room air Extremities: warm, well perfused, No edema, cyanosis or clubbing; +2 pulses bilaterally MSK: normal gait and normal station; has full AROM but has pain in both extension and flexion of c-spine. No nuchal rigidity.  No midline TTP. No palpable bony abnormalities. Skin: dry; intact; no rashes or lesions Neuro: 5/5 UE Strength and light touch sensation grossly intact, CN 2-12 grossly in tact  Assessment/ Plan: 56 y.o. female   1. Neck pain.  Likely related to underlying DJD.  She also has h/o RA.  There are no associated neurologic symptoms at this time.  No evidence of infection.  I could not find past imaging studies of her C-spine.  I will obtain these today.  I encouraged patient to follow up with her PCP in about 1 week to go over these studies and develop a more solid plan at that time.  In the interim, patient can take OTC NSAIDs or use topical analgesic for pain.  2. Elevated TSH.  It appears her FT4 and T3 were normal in 2016.  Her TSH at that time was 5.532.  Her thyroid exam was fairly unremarkable today.  However, her reports of difficulty swallowing, malaise and constipation make me concerned for possible undiagnosed thyroid disorder.  Given her number of concerns today, I did not order more advanced studies but I would consider  swallow study vs referral to GI for possible EGD to further evaluate this if her TSH is unrevealing. - TSH  3. DJD (degenerative joint disease), cervical - DG Cervical Spine Complete; Future  4. Medication management.  Discussed care with in house pharmacist.  It seems that she is most at risk for Katherina Right with Methotrexate.  There was no evidence of that on her exam today.  However, I will obtain basic labs to evaluate for organ damage related to her taking extra medication.  I encouraged her  to contact her Rheumatologist to see if she should hold this weeks dose. - CMP14+EGFR - CBC  Follow up scheduled with Dr Ola Spurr on 5/21.  I will defer further management her pending above studies.   [name], DO PGY-3, [facility]

## 2017-03-26 ENCOUNTER — Telehealth: Payer: Self-pay | Admitting: Family Medicine

## 2017-03-26 ENCOUNTER — Other Ambulatory Visit: Payer: Self-pay | Admitting: Internal Medicine

## 2017-03-26 ENCOUNTER — Telehealth: Payer: Self-pay | Admitting: Internal Medicine

## 2017-03-26 DIAGNOSIS — R7989 Other specified abnormal findings of blood chemistry: Secondary | ICD-10-CM

## 2017-03-26 LAB — CMP14+EGFR
A/G RATIO: 1.5 (ref 1.2–2.2)
ALT: 10 IU/L (ref 0–32)
AST: 20 IU/L (ref 0–40)
Albumin: 4.4 g/dL (ref 3.5–5.5)
Alkaline Phosphatase: 61 IU/L (ref 39–117)
BUN/Creatinine Ratio: 18 (ref 9–23)
BUN: 13 mg/dL (ref 6–24)
Bilirubin Total: 0.5 mg/dL (ref 0.0–1.2)
CALCIUM: 9.2 mg/dL (ref 8.7–10.2)
CO2: 21 mmol/L (ref 18–29)
CREATININE: 0.74 mg/dL (ref 0.57–1.00)
Chloride: 97 mmol/L (ref 96–106)
GFR, EST AFRICAN AMERICAN: 104 mL/min/{1.73_m2} (ref >59)
GFR, EST NON AFRICAN AMERICAN: 90 mL/min/{1.73_m2} (ref >59)
GLOBULIN, TOTAL: 3 g/dL (ref 1.5–4.5)
Glucose: 95 mg/dL (ref 65–99)
POTASSIUM: 4.2 mmol/L (ref 3.5–5.2)
SODIUM: 137 mmol/L (ref 134–144)
Total Protein: 7.4 g/dL (ref 6.0–8.5)

## 2017-03-26 LAB — CBC
HEMOGLOBIN: 12.4 g/dL (ref 11.1–15.9)
Hematocrit: 37.7 % (ref 34.0–46.6)
MCH: 30.2 pg (ref 26.6–33.0)
MCHC: 32.9 g/dL (ref 31.5–35.7)
MCV: 92 fL (ref 79–97)
Platelets: 204 10*3/uL (ref 150–379)
RBC: 4.11 x10E6/uL (ref 3.77–5.28)
RDW: 14.6 % (ref 12.3–15.4)
WBC: 7.8 10*3/uL (ref 3.4–10.8)

## 2017-03-26 LAB — TSH: TSH: 7.01 u[IU]/mL — AB (ref 0.450–4.500)

## 2017-03-26 NOTE — Telephone Encounter (Signed)
Placed future lab orders for T3, free T4, TPO, and thyroglobulin Abs as recommended by Dr. Nadine Counts.

## 2017-03-26 NOTE — Telephone Encounter (Signed)
Attempted to reach patient regarding abnormal TSH level.  [employer] phone interpreter [place], [place] [phone] used for Arabic translation of this message.  I recommended that she return to see PCP for labs.   I recommend that she have T3, FT4, TPO and Thyroglobulin Ab's checked.  Consider thyroid ultrasound.  Should she be found to be truly hypothyroid, her needed start dose for Synthroid would be 1-24mcg/kg ~ 75 mcg.  Will defer to PCP for continued management and ordering of these labs.  Ashly M. Nadine Counts, DO PGY-3, [facility]

## 2017-03-26 NOTE — Telephone Encounter (Signed)
Pt begins fasting on may 16 thru may 22. She wants to have her labs done prior to that. Appt was scheduled for may 15.  Please put in lab orders

## 2017-03-30 ENCOUNTER — Other Ambulatory Visit: Payer: Medicaid Other

## 2017-04-05 ENCOUNTER — Encounter: Payer: Self-pay | Admitting: Internal Medicine

## 2017-04-05 ENCOUNTER — Ambulatory Visit (INDEPENDENT_AMBULATORY_CARE_PROVIDER_SITE_OTHER): Payer: Medicaid Other | Admitting: Internal Medicine

## 2017-04-05 VITALS — BP 106/68 | HR 68 | Temp 98.2°F | Ht 62.99 in | Wt 161.0 lb

## 2017-04-05 DIAGNOSIS — R946 Abnormal results of thyroid function studies: Secondary | ICD-10-CM | POA: Diagnosis not present

## 2017-04-05 DIAGNOSIS — Z114 Encounter for screening for human immunodeficiency virus [HIV]: Secondary | ICD-10-CM | POA: Diagnosis not present

## 2017-04-05 DIAGNOSIS — R7989 Other specified abnormal findings of blood chemistry: Secondary | ICD-10-CM

## 2017-04-05 DIAGNOSIS — Z0000 Encounter for general adult medical examination without abnormal findings: Secondary | ICD-10-CM | POA: Diagnosis not present

## 2017-04-05 DIAGNOSIS — Z1211 Encounter for screening for malignant neoplasm of colon: Secondary | ICD-10-CM | POA: Diagnosis not present

## 2017-04-05 DIAGNOSIS — Z1159 Encounter for screening for other viral diseases: Secondary | ICD-10-CM | POA: Diagnosis not present

## 2017-04-05 NOTE — Patient Instructions (Signed)
Patient's son needs an appointment to discuss mood at earliest availability.  Patient needs an appointment in July for pap smear.           .        .        .          .           Marland Kitchen             Marland Kitchen

## 2017-04-05 NOTE — Progress Notes (Signed)
[facility] Progress Note  Subjective:  Ashley Bates is a 56 y.o. female with history of DDD, RA, vitamin D deficiency, and post-surgical hypothyroidism who presents for general check-up. She is accompanied by her son. Visit assisted by Arabic video interpreter Husam ([id number]).   Current concerns include recent labs. Reports neck pain is stable and knows she has "problem with C4/C5--just the vertebrae" from past evaluation but has not yet had cervical spine x-ray performed that was ordered by Dr. Lajuana Ripple at last visit; has never tried PT. In addition, patient said trouble swallowing she has been having the last month has since resolved. She also notes headache but attributes this to current fasting and denies vision changes, nausea or weakness.   #Elevated TSH - Reports increased fatigue and always feeling cold but denies dry skin or hair loss. Does think her weight has increased.  - History of thyroidectomy for nodule in 2006 but does not have records of operation, as was performed prior to arrival to [place] - Thinks she was prescribed medication for hypothyroidism in past but never took - Previous PCP questioned if perhaps she had only partial thyroidectomy, as TSH has only been mildly elevated and has had normal T3 and T4  #Health Maintenance - Requests [facility] arrange mammogram appointment for her, due to language barrier - Refuses colonoscopy but denies melena. She does have history of hemorrhoids and occasionally sees BRB on toilet paper but denies seeing anything in toilet bowel. Agrees to taking stool cards home. Normal hemoglobin earlier this month.  - Due for HIV and Hep C testing.  - Declines pap smear today stating religious reasons while fasting but says will schedule to do this summer   Review of Systems  Constitutional: Positive for malaise/fatigue. Negative for fever.  HENT: Negative for congestion and hearing loss.   Eyes: Negative for blurred vision and  pain.  Respiratory: Negative for cough and shortness of breath.   Cardiovascular: Negative for chest pain and palpitations.  Gastrointestinal: Negative for melena, nausea and vomiting.  Genitourinary: Negative for dysuria and frequency.  Musculoskeletal: Positive for neck pain. Negative for falls.  Skin: Negative for rash.  Neurological: Positive for headaches. Negative for speech change and focal weakness.  Psychiatric/Behavioral: Negative for depression and substance abuse.    Past Medical History:  Diagnosis Date  . Arthritis   . Back pain   . DDD (degenerative disc disease), cervical 10/06/2016  . DDD (degenerative disc disease), lumbar 10/06/2016  . 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 Post-surgical hypothyroidism   . Scoliosis   . Spinal stenosis   . Vitamin D deficiency 10/06/2016    Social History   Social History  . Marital status: Married    Spouse name: N/A  . Number of children: N/A  . Years of education: N/A   Occupational History  . Not on file.   Social History Main Topics  . Smoking status: Former Research scientist (life sciences)  . Smokeless tobacco: Never Used  . Alcohol use No  . Drug use: No  . Sexual activity: Yes    Birth control/ protection: Post-menopausal   Other Topics Concern  . Not on file   Social History Narrative   Immigrant Social History:   - Date arrived in [place]: 01/18/14   - Country of origin: [place]   - Location of refugee camp (if applicable), how long there, and what caused patient to leave home country?: No camp, was able to flee country straight to [place]   -  Primary language: Arabic   -Requires intepreter (essentially speaks no [place])   - Tobacco/alcohol/drug use: denies   - Marriage Status: Married. With 1 son    No Known Allergies  Objective: Blood pressure 106/68, pulse 68, temperature 98.2 F (36.8 C), temperature source Oral, height 5' 2.99" (1.6 m), weight 161 lb (73 kg), SpO2 98 %. Body mass index is 28.53  kg/m. Constitutional: Pleasant female, in NAD, appears older than stated age HENT: MMM. Horizontal scar present across anterior neck and no palpable thyroid.  Cardiovascular: RRR, S1, S2, no m/r/g.  Pulmonary/Chest: Effort normal and breath sounds normal. No respiratory distress.  Abdominal: Soft. +BS, NT, ND Musculoskeletal: No LE edema.  Neurological: AOx3, no focal deficits. Skin: Skin is warm and dry. No rash noted.  Psychiatric: Normal mood and affect.  Vitals reviewed  Recent Results (from the past 2160 hour(s))  COMPLETE METABOLIC PANEL WITH GFR     Status: None   Collection Time: 02/23/17  3:52 PM  Result Value Ref Range   Sodium 142 135 - 146 mmol/L   Potassium 5.0 3.5 - 5.3 mmol/L   Chloride 107 98 - 110 mmol/L   CO2 28 20 - 31 mmol/L   Glucose, Bld 73 65 - 99 mg/dL   BUN 12 7 - 25 mg/dL   Creat 0.86 0.50 - 1.05 mg/dL    Comment:   For patients > or = 56 years of age: The upper reference limit for Creatinine is approximately 13% higher for people identified as African-American.      Total Bilirubin 0.5 0.2 - 1.2 mg/dL   Alkaline Phosphatase 58 33 - 130 U/L   AST 16 10 - 35 U/L   ALT 10 6 - 29 U/L   Total Protein 7.0 6.1 - 8.1 g/dL   Albumin 4.2 3.6 - 5.1 g/dL   Calcium 9.1 8.6 - 10.4 mg/dL   GFR, Est African American 87 >=60 mL/min   GFR, Est Non African American 75 >=60 mL/min  CBC with Differential/Platelet     Status: None   Collection Time: 02/23/17  3:52 PM  Result Value Ref Range   WBC 8.3 3.8 - 10.8 K/uL   RBC 4.23 3.80 - 5.10 MIL/uL   Hemoglobin 12.7 11.7 - 15.5 g/dL   HCT 38.0 35.0 - 45.0 %   MCV 89.8 80.0 - 100.0 fL   MCH 30.0 27.0 - 33.0 pg   MCHC 33.4 32.0 - 36.0 g/dL   RDW 14.3 11.0 - 15.0 %   Platelets 220 140 - 400 K/uL   MPV 11.8 7.5 - 12.5 fL   Neutro Abs 5,063 1,500 - 7,800 cells/uL   Lymphs Abs 2,573 850 - 3,900 cells/uL   Monocytes Absolute 498 200 - 950 cells/uL   Eosinophils Absolute 83 15 - 500 cells/uL   Basophils Absolute 83  0 - 200 cells/uL   Neutrophils Relative % 61 %   Lymphocytes Relative 31 %   Monocytes Relative 6 %   Eosinophils Relative 1 %   Basophils Relative 1 %   Smear Review Criteria for review not met   CMP14+EGFR     Status: None   Collection Time: 03/25/17 10:45 AM  Result Value Ref Range   Glucose 95 65 - 99 mg/dL   BUN 13 6 - 24 mg/dL   Creatinine, Ser 0.74 0.57 - 1.00 mg/dL   GFR calc non Af Amer 90 >59 mL/min/1.73   GFR calc Af Amer 104 >59 mL/min/1.73   BUN/Creatinine Ratio  18 9 - 23   Sodium 137 134 - 144 mmol/L   Potassium 4.2 3.5 - 5.2 mmol/L   Chloride 97 96 - 106 mmol/L   CO2 21 18 - 29 mmol/L   Calcium 9.2 8.7 - 10.2 mg/dL   Total Protein 7.4 6.0 - 8.5 g/dL   Albumin 4.4 3.5 - 5.5 g/dL   Globulin, Total 3.0 1.5 - 4.5 g/dL   Albumin/Globulin Ratio 1.5 1.2 - 2.2   Bilirubin Total 0.5 0.0 - 1.2 mg/dL   Alkaline Phosphatase 61 39 - 117 IU/L   AST 20 0 - 40 IU/L   ALT 10 0 - 32 IU/L  CBC     Status: None   Collection Time: 03/25/17 10:45 AM  Result Value Ref Range   WBC 7.8 3.4 - 10.8 x10E3/uL   RBC 4.11 3.77 - 5.28 x10E6/uL   Hemoglobin 12.4 11.1 - 15.9 g/dL   Hematocrit 37.7 34.0 - 46.6 %   MCV 92 79 - 97 fL   MCH 30.2 26.6 - 33.0 pg   MCHC 32.9 31.5 - 35.7 g/dL   RDW 14.6 12.3 - 15.4 %   Platelets 204 150 - 379 x10E3/uL  TSH     Status: Abnormal   Collection Time: 03/25/17 10:45 AM  Result Value Ref Range   TSH 7.010 (H) 0.450 - 4.500 uIU/mL    Assessment/Plan: Elevated TSH - Reports convincing symptoms of cold intolerance and increased fatigue and has history of thyroidectomy. Will repeat TSH, T3, and free T4 today. - If TSH still elevated with low T4 would treat with levothyroxine 1-2 mcg/kg. If only TSH elevated, would treat as subclinical hypothyroidism, starting at 25 mcg.   Healthcare maintenance - Screen for HIV and Hep C - Provided stool cards as alternative to colonoscopy (refused) - Mammogram scheduled for patient - Patient to return this summer  for pap smear   Prefers letter sent to home with results and plan rather than phone call, but in future, would like [facility] to call her son at [phone] (added to demographics).   Follow-up at earliest convenience to have pap smear performed.  Olene Floss, MD [place], PGY-2

## 2017-04-06 ENCOUNTER — Ambulatory Visit
Admission: RE | Admit: 2017-04-06 | Discharge: 2017-04-06 | Disposition: A | Discharge status: Home / Self Care | Payer: Medicaid Other | Source: Ambulatory Visit | Attending: Family Medicine | Admitting: Family Medicine

## 2017-04-06 LAB — T3: T3 TOTAL: 121 ng/dL (ref 71–180)

## 2017-04-06 LAB — T4, FREE: FREE T4: 1.09 ng/dL (ref 0.82–1.77)

## 2017-04-06 LAB — TSH: TSH: 5.23 u[IU]/mL — ABNORMAL HIGH (ref 0.450–4.500)

## 2017-04-06 LAB — HIV ANTIBODY (ROUTINE TESTING W REFLEX): HIV Screen 4th Generation wRfx: NONREACTIVE

## 2017-04-06 LAB — HEPATITIS C ANTIBODY: Hep C Virus Ab: <0.1 s/co ratio (ref 0.0–0.9)

## 2017-04-08 ENCOUNTER — Other Ambulatory Visit: Payer: Self-pay | Admitting: Family Medicine

## 2017-04-08 DIAGNOSIS — R928 Other abnormal and inconclusive findings on diagnostic imaging of breast: Secondary | ICD-10-CM

## 2017-04-08 DIAGNOSIS — Z0000 Encounter for general adult medical examination without abnormal findings: Secondary | ICD-10-CM | POA: Insufficient documentation

## 2017-04-08 NOTE — Assessment & Plan Note (Signed)
-   Screen for HIV and Hep C - Provided stool cards as alternative to colonoscopy (refused) - Mammogram scheduled for patient - Patient to return this summer for pap smear

## 2017-04-08 NOTE — Assessment & Plan Note (Signed)
-   Reports convincing symptoms of cold intolerance and increased fatigue and has history of thyroidectomy. Will repeat TSH, T3, and free T4 today. - If TSH still elevated with low T4 would treat with levothyroxine 1-2 mcg/kg. If only TSH elevated, would treat as subclinical hypothyroidism, starting at 25 mcg.

## 2017-04-09 ENCOUNTER — Encounter: Payer: Self-pay | Admitting: Internal Medicine

## 2017-04-09 ENCOUNTER — Other Ambulatory Visit: Payer: Self-pay | Admitting: Internal Medicine

## 2017-04-09 MED ORDER — LEVOTHYROXINE SODIUM 25 MCG PO TABS: 25.0000 ug | ORAL_TABLET | Freq: Every day | ORAL | 2 refills | Status: DC

## 2017-04-16 ENCOUNTER — Ambulatory Visit
Admission: RE | Admit: 2017-04-16 | Discharge: 2017-04-16 | Disposition: A | Discharge status: Home / Self Care | Payer: Medicaid Other | Source: Ambulatory Visit | Attending: Family Medicine | Admitting: Family Medicine

## 2017-04-16 DIAGNOSIS — R928 Other abnormal and inconclusive findings on diagnostic imaging of breast: Secondary | ICD-10-CM

## 2017-05-16 ENCOUNTER — Inpatient Hospital Stay (HOSPITAL_COMMUNITY)
Admission: EM | Admit: 2017-05-16 | Discharge: 2017-05-19 | DRG: 607 | Disposition: A | Discharge status: Home / Self Care | Payer: Medicaid Other | Attending: Internal Medicine | Admitting: Internal Medicine

## 2017-05-16 ENCOUNTER — Encounter (HOSPITAL_COMMUNITY): Payer: Self-pay | Admitting: Emergency Medicine

## 2017-05-16 DIAGNOSIS — S80862A Insect bite (nonvenomous), left lower leg, initial encounter: Principal | ICD-10-CM | POA: Diagnosis present

## 2017-05-16 DIAGNOSIS — R509 Fever, unspecified: Secondary | ICD-10-CM

## 2017-05-16 DIAGNOSIS — Z7952 Long term (current) use of systemic steroids: Secondary | ICD-10-CM

## 2017-05-16 DIAGNOSIS — A419 Sepsis, unspecified organism: Secondary | ICD-10-CM

## 2017-05-16 DIAGNOSIS — E860 Dehydration: Secondary | ICD-10-CM | POA: Diagnosis present

## 2017-05-16 DIAGNOSIS — E2740 Unspecified adrenocortical insufficiency: Secondary | ICD-10-CM | POA: Diagnosis present

## 2017-05-16 DIAGNOSIS — R6510 Systemic inflammatory response syndrome (SIRS) of non-infectious origin without acute organ dysfunction: Secondary | ICD-10-CM | POA: Diagnosis present

## 2017-05-16 DIAGNOSIS — M069 Rheumatoid arthritis, unspecified: Secondary | ICD-10-CM | POA: Diagnosis present

## 2017-05-16 DIAGNOSIS — Z79899 Other long term (current) drug therapy: Secondary | ICD-10-CM

## 2017-05-16 DIAGNOSIS — M1990 Unspecified osteoarthritis, unspecified site: Secondary | ICD-10-CM | POA: Diagnosis present

## 2017-05-16 DIAGNOSIS — W57XXXA Bitten or stung by nonvenomous insect and other nonvenomous arthropods, initial encounter: Secondary | ICD-10-CM | POA: Diagnosis present

## 2017-05-16 DIAGNOSIS — E890 Postprocedural hypothyroidism: Secondary | ICD-10-CM | POA: Diagnosis present

## 2017-05-16 DIAGNOSIS — Y92009 Unspecified place in unspecified non-institutional (private) residence as the place of occurrence of the external cause: Secondary | ICD-10-CM

## 2017-05-16 DIAGNOSIS — Z87891 Personal history of nicotine dependence: Secondary | ICD-10-CM

## 2017-05-16 DIAGNOSIS — S80861A Insect bite (nonvenomous), right lower leg, initial encounter: Secondary | ICD-10-CM | POA: Diagnosis present

## 2017-05-16 LAB — CBC
HCT: 39.7 % (ref 36.0–46.0)
HEMOGLOBIN: 13.5 g/dL (ref 12.0–15.0)
MCH: 30.5 pg (ref 26.0–34.0)
MCHC: 34 g/dL (ref 30.0–36.0)
MCV: 89.6 fL (ref 78.0–100.0)
Platelets: 185 10*3/uL (ref 150–400)
RBC: 4.43 MIL/uL (ref 3.87–5.11)
RDW: 14 % (ref 11.5–15.5)
WBC: 11.6 10*3/uL — ABNORMAL HIGH (ref 4.0–10.5)

## 2017-05-16 LAB — BASIC METABOLIC PANEL
ANION GAP: 9 (ref 5–15)
BUN: 14 mg/dL (ref 6–20)
CALCIUM: 9.1 mg/dL (ref 8.9–10.3)
CO2: 23 mmol/L (ref 22–32)
Chloride: 103 mmol/L (ref 101–111)
Creatinine, Ser: 0.84 mg/dL (ref 0.44–1.00)
Glucose, Bld: 113 mg/dL — ABNORMAL HIGH (ref 65–99)
Potassium: 3.6 mmol/L (ref 3.5–5.1)
SODIUM: 135 mmol/L (ref 135–145)

## 2017-05-16 LAB — DIFFERENTIAL
BASOS ABS: 0 10*3/uL (ref 0.0–0.1)
BASOS PCT: 0 %
Eosinophils Absolute: 0 10*3/uL (ref 0.0–0.7)
Eosinophils Relative: 0 %
Lymphocytes Relative: 12 %
Lymphs Abs: 1.4 10*3/uL (ref 0.7–4.0)
MONO ABS: 0.5 10*3/uL (ref 0.1–1.0)
Monocytes Relative: 4 %
NEUTROS ABS: 9.6 10*3/uL — AB (ref 1.7–7.7)
Neutrophils Relative %: 84 %

## 2017-05-16 LAB — I-STAT TROPONIN, ED: TROPONIN I, POC: 0 ng/mL (ref 0.00–0.08)

## 2017-05-16 NOTE — ED Notes (Signed)
Bed: WA16 Expected date:  Expected time:  Means of arrival:  Comments: 

## 2017-05-16 NOTE — ED Triage Notes (Signed)
Pt comes from home , started feeling sick yesterday morning, legs feel very heavy, dizziness and weakness with nausea. Pt speaks arabic, family in room along with walle. Pt has been on methotrexate and chemo.  Last chem tx was Friday.   Pt delay taking meds Friday. ( 8 pills) but only had 4 and took them late.  V/s 124/87, hr108, 96 room air, 92 cbg.  rr 20.

## 2017-05-16 NOTE — ED Provider Notes (Signed)
TIME SEEN: 11:41 PM  CHIEF COMPLAINT: Generalized weakness, headache, dizziness  HPI: Patient is a 56 year old female with history of rheumatoid arthritis on methotrexate who presents emergency department with feeling weak all over, having body aches, diffuse throbbing headache, vertigo, nausea. Found to be febrile in the emergency department. She states she has had chills at home. No cough, abdominal pain, vomiting, diarrhea, dysuria, hematuria. States she has pulled several ticks off of her recently. She does have neck pain or neck stiffness. No rash.  ROS: See HPI Constitutional:  fever  Eyes: no drainage  ENT: no runny nose   Cardiovascular:  no chest pain  Resp: no SOB  GI: no vomiting GU: no dysuria Integumentary: no rash  Allergy: no hives  Musculoskeletal: no leg swelling  Neurological: no slurred speech ROS otherwise negative  PAST MEDICAL HISTORY/PAST SURGICAL HISTORY:  Past Medical History:  Diagnosis Date  . Arthritis   . Back pain   . DDD (degenerative disc disease), cervical 10/06/2016  . DDD (degenerative disc disease), lumbar 10/06/2016  . 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 Post-surgical hypothyroidism   . Scoliosis   . Spinal stenosis   . Vitamin D deficiency 10/06/2016    MEDICATIONS:  Prior to Admission medications   Medication Sig Start Date End Date Taking? Authorizing Provider  cyclobenzaprine (FLEXERIL) 5 MG tablet Take 1 tablet (5 mg total) by mouth 3 (three) times daily as needed for muscle spasms. 05/04/16   Ashley Rasmussen, NP  diclofenac sodium (VOLTAREN) 1 % GEL Apply 1 application topically 4 (four) times daily. 05/04/16   Ashley Rasmussen, NP  folic acid (FOLVITE) 1 MG tablet Take 2 tablets (2 mg total) by mouth daily. 12/24/16 03/19/18  Bates, Naitik, PA-C  levothyroxine (SYNTHROID, LEVOTHROID) 25 MCG tablet Take 1 tablet (25 mcg total) by mouth daily before breakfast. khudh 1 qurs yawmiaan 30 daqiqat qabl al'iiftar. 04/09/17   Ashley Burkitt, MD  methotrexate (RHEUMATREX) 2.5 MG tablet Take 8 tablets (20 mg total) by mouth once a week. Caution:Chemotherapy. Protect from light. 12/24/16   Bates, Naitik, PA-C  naproxen (NAPROSYN) 375 MG tablet Take 1 tablet (375 mg total) by mouth 2 (two) times daily. 05/04/16   Ashley Rasmussen, NP  polyethylene glycol powder (GLYCOLAX/MIRALAX) powder Take 17 g by mouth 2 (two) times daily as needed. Patient not taking: Reported on 10/07/2016 02/26/16   Ashley Dodrill, MD  predniSONE (DELTASONE) 5 MG tablet 4po qAM x 5 days;3po qAM x 5 days,2po qAM x 5 days; 1po qAM x 5 days;1/2po qAM x 5 days;then stop. 12/24/16   Bates, Naitik, PA-C  triamcinolone cream (KENALOG) 0.5 % APP TO NECK D 02/10/16   [provider]    ALLERGIES:  No Known Allergies  SOCIAL HISTORY:  Social History  Substance Use Topics  . Smoking status: Former Games developer  . Smokeless tobacco: Never Used  . Alcohol use No    FAMILY HISTORY: Family History  Problem Relation Age of Onset  . Diabetes Neg Hx   . Hyperlipidemia Neg Hx   . Hypertension Neg Hx   . Heart failure Neg Hx     EXAM: BP (!) 111/56 (BP Location: Right Arm)   Pulse (!) 102   Temp 99.1 F (37.3 C) (Oral)   Resp 18   Ht 5' 2.99" (1.6 m)   Wt 70 kg (154 lb 5.2 oz)   SpO2 95%   BMI 27.34 kg/m  CONSTITUTIONAL: Alert and oriented and responds appropriately to questions.  Febrile, appears uncomfortable but is nontoxic HEAD: Normocephalic EYES: Conjunctivae clear, pupils appear equal, EOMI ENT: normal nose; moist mucous membranes, no pharyngeal erythema, no tonsillar hypertrophy or exudate, normal phonation, no stridor, no trismus or drooling NECK: Supple, she does have pain with flexion of her neck, no midline spinal tenderness or step-off or deformity, no nuchal rigidity, no LAD  CARD: Regular and tachycardic; S1 and S2 appreciated; no murmurs, no clicks, no rubs, no gallops RESP: Normal chest excursion without splinting or tachypnea; breath  sounds clear and equal bilaterally; no wheezes, no rhonchi, no rales, no hypoxia or respiratory distress, speaking full sentences ABD/GI: Normal bowel sounds; non-distended; soft, non-tender, no rebound, no guarding, no peritoneal signs, no hepatosplenomegaly BACK:  The back appears normal and is non-tender to palpation, there is no CVA tenderness EXT: Normal ROM in all joints; non-tender to palpation; no edema; normal capillary refill; no cyanosis, no calf tenderness or swelling    SKIN: Normal color for age and race; warm; no rash, multiple scattered papular lesions to her lower extremities that she reports are from recent insect bites without surrounding erythema or warmth, no petechiae or purpura, no blisters or desquamation NEURO: Moves all extremities equally, sensation to light touch intact diffusely, normal speech, cranial nerves II through intact PSYCH: The patient's mood and manner are appropriate. Grooming and personal hygiene are appropriate.  MEDICAL DECISION MAKING: Patient here with fever, headache, neck pain or neck stiffness, recent tick bites. She is febrile, tachycardic and has soft blood pressures. Concern for sepsis. Will give broad-spectrum antibiotics and IV fluids. Will obtain labs, urine, blood cultures. I also feel she will need a lumbar puncture. I have performed history, physical and discussed lumbar puncture and obtain consent using Arabic interpreter.  Patient agrees to this procedure.  ED PROGRESS: Lactate normal. Mild leukocytosis. Blood work to evaluate for tick borne illness pending. Chest x-ray clear. Urine shows no sign of infection. I have also given patient IV doxycycline and IV acyclovir.  2:09 AM Discussed patient's case with hospitalist, Dr. Toniann Bates.  I have recommended admission and patient (and family if present) agree with this plan. Admitting physician will place admission orders.   I reviewed all nursing notes, vitals, pertinent previous records, EKGs,  lab and urine results, imaging (as available).  3:55 AM  Pt's CSF is unremarkable.   EKG Interpretation  Date/Time:  Sunday May 16 2017 23:07:06 EDT Ventricular Rate:  101 PR Interval:    QRS Duration: 91 QT Interval:  333 QTC Calculation: 432 R Axis:   48 Text Interpretation:  Sinus tachycardia Borderline T abnormalities, anterior leads No old tracing to compare Confirmed by Ward, Baxter Hire [phone]) on 05/16/2017 11:41:46 PM        LUMBAR PUNCTURE: Date/Time:  2:01 AM 05/17/17 Performed by: Raelyn Number Authorized by: Raelyn Number   Consent:    Consent obtained:  Written   Consent given by:  Patient   Risks discussed:  Bleeding, infection, pain, nerve damage, repeat procedure and headache   Alternatives discussed:  No treatment, delayed treatment, alternative treatment, observation and referral Pre-procedure details:    Procedure purpose:  Diagnostic   Preparation: Patient was prepped and draped in usual sterile fashion Sedation:    Sedation type:  None Anesthesia (see MAR for exact dosages):    Anesthesia method:  Local infiltration   Local anesthetic:  Lidocaine 1% w/o epi Procedure details:    Lumbar space:  L3-L4 interspace   Patient position:  Sitting  Needle gauge:  18   Needle type:  Spinal needle - Quincke tip   Needle length (in):  2.5   Ultrasound guidance: no     Number of attempts:  1   Opening pressure (cm H2O):     Closing pressure (cm H2O):     Fluid appearance:  Clear   Tubes of fluid:  4   Total volume (ml):  9 mL Post-procedure:    Puncture site:  Adhesive bandage applied and direct pressure applied   Patient tolerance of procedure:  Tolerated well, no immediate complications   CRITICAL CARE Performed by: Raelyn Number   Total critical care time: 65 minutes  Critical care time was exclusive of separately billable procedures and treating other patients.  Critical care was necessary to treat or prevent imminent or life-threatening  deterioration.  Critical care was time spent personally by me on the following activities: development of treatment plan with patient and/or surrogate as well as nursing, discussions with consultants, evaluation of patient's response to treatment, examination of patient, obtaining history from patient or surrogate, ordering and performing treatments and interventions, ordering and review of laboratory studies, ordering and review of radiographic studies, pulse oximetry and re-evaluation of patient's condition.  .   Ward, Layla Maw, DO 05/17/17 [phone]

## 2017-05-17 ENCOUNTER — Emergency Department (HOSPITAL_COMMUNITY): Payer: Medicaid Other

## 2017-05-17 ENCOUNTER — Encounter (HOSPITAL_COMMUNITY): Payer: Self-pay | Admitting: Internal Medicine

## 2017-05-17 DIAGNOSIS — Y92009 Unspecified place in unspecified non-institutional (private) residence as the place of occurrence of the external cause: Secondary | ICD-10-CM | POA: Diagnosis not present

## 2017-05-17 DIAGNOSIS — M069 Rheumatoid arthritis, unspecified: Secondary | ICD-10-CM | POA: Diagnosis present

## 2017-05-17 DIAGNOSIS — Z87891 Personal history of nicotine dependence: Secondary | ICD-10-CM | POA: Diagnosis not present

## 2017-05-17 DIAGNOSIS — M1990 Unspecified osteoarthritis, unspecified site: Secondary | ICD-10-CM | POA: Diagnosis present

## 2017-05-17 DIAGNOSIS — E039 Hypothyroidism, unspecified: Secondary | ICD-10-CM | POA: Diagnosis not present

## 2017-05-17 DIAGNOSIS — E2740 Unspecified adrenocortical insufficiency: Secondary | ICD-10-CM | POA: Diagnosis present

## 2017-05-17 DIAGNOSIS — Z7952 Long term (current) use of systemic steroids: Secondary | ICD-10-CM | POA: Diagnosis not present

## 2017-05-17 DIAGNOSIS — R509 Fever, unspecified: Secondary | ICD-10-CM | POA: Diagnosis present

## 2017-05-17 DIAGNOSIS — A879 Viral meningitis, unspecified: Secondary | ICD-10-CM | POA: Diagnosis not present

## 2017-05-17 DIAGNOSIS — R6510 Systemic inflammatory response syndrome (SIRS) of non-infectious origin without acute organ dysfunction: Secondary | ICD-10-CM | POA: Diagnosis present

## 2017-05-17 DIAGNOSIS — E860 Dehydration: Secondary | ICD-10-CM | POA: Diagnosis present

## 2017-05-17 DIAGNOSIS — S80861A Insect bite (nonvenomous), right lower leg, initial encounter: Secondary | ICD-10-CM | POA: Diagnosis present

## 2017-05-17 DIAGNOSIS — Z79899 Other long term (current) drug therapy: Secondary | ICD-10-CM | POA: Diagnosis not present

## 2017-05-17 DIAGNOSIS — S80862A Insect bite (nonvenomous), left lower leg, initial encounter: Secondary | ICD-10-CM | POA: Diagnosis present

## 2017-05-17 DIAGNOSIS — M0579 Rheumatoid arthritis with rheumatoid factor of multiple sites without organ or systems involvement: Secondary | ICD-10-CM | POA: Diagnosis not present

## 2017-05-17 DIAGNOSIS — E890 Postprocedural hypothyroidism: Secondary | ICD-10-CM | POA: Diagnosis present

## 2017-05-17 DIAGNOSIS — W57XXXA Bitten or stung by nonvenomous insect and other nonvenomous arthropods, initial encounter: Secondary | ICD-10-CM | POA: Diagnosis present

## 2017-05-17 LAB — HEPATIC FUNCTION PANEL
ALK PHOS: 64 U/L (ref 38–126)
ALT: 39 U/L (ref 14–54)
AST: 37 U/L (ref 15–41)
Albumin: 4.5 g/dL (ref 3.5–5.0)
BILIRUBIN DIRECT: 0.1 mg/dL (ref 0.1–0.5)
BILIRUBIN INDIRECT: 0.9 mg/dL (ref 0.3–0.9)
Total Bilirubin: 1 mg/dL (ref 0.3–1.2)
Total Protein: 8.4 g/dL — ABNORMAL HIGH (ref 6.5–8.1)

## 2017-05-17 LAB — GLUCOSE, CSF: Glucose, CSF: 58 mg/dL (ref 40–70)

## 2017-05-17 LAB — CBC
HCT: 32.6 % — ABNORMAL LOW (ref 36.0–46.0)
HEMOGLOBIN: 10.9 g/dL — AB (ref 12.0–15.0)
MCH: 30.2 pg (ref 26.0–34.0)
MCHC: 33.4 g/dL (ref 30.0–36.0)
MCV: 90.3 fL (ref 78.0–100.0)
PLATELETS: 157 10*3/uL (ref 150–400)
RBC: 3.61 MIL/uL — ABNORMAL LOW (ref 3.87–5.11)
RDW: 14.2 % (ref 11.5–15.5)
WBC: 10.2 10*3/uL (ref 4.0–10.5)

## 2017-05-17 LAB — LACTIC ACID, PLASMA
Lactic Acid, Venous: 1.2 mmol/L (ref 0.5–1.9)
Lactic Acid, Venous: 0.9 mmol/L (ref 0.5–1.9)
Lactic Acid, Venous: 1.7 mmol/L (ref 0.5–1.9)

## 2017-05-17 LAB — PROCALCITONIN: Procalcitonin: 0.1 ng/mL

## 2017-05-17 LAB — BASIC METABOLIC PANEL
ANION GAP: 7 (ref 5–15)
BUN: 12 mg/dL (ref 6–20)
CALCIUM: 7.4 mg/dL — AB (ref 8.9–10.3)
CO2: 20 mmol/L — AB (ref 22–32)
CREATININE: 0.72 mg/dL (ref 0.44–1.00)
Chloride: 112 mmol/L — ABNORMAL HIGH (ref 101–111)
GFR calc Af Amer: >60 mL/min (ref >60)
GLUCOSE: 109 mg/dL — AB (ref 65–99)
Potassium: 3.8 mmol/L (ref 3.5–5.1)
Sodium: 139 mmol/L (ref 135–145)

## 2017-05-17 LAB — CSF CELL COUNT WITH DIFFERENTIAL
EOS CSF: 1 % (ref 0–1)
Lymphs, CSF: 47 % (ref 40–80)
MONOCYTE-MACROPHAGE-SPINAL FLUID: 7 % — AB (ref 15–45)
RBC COUNT CSF: 4 /mm3 — AB
RBC Count, CSF: 237 /mm3 — ABNORMAL HIGH
SEGMENTED NEUTROPHILS-CSF: 45 % — AB (ref 0–6)
TUBE #: 1
TUBE #: 4
WBC, CSF: 0 /mm3 (ref 0–5)
WBC, CSF: 17 /mm3 (ref 0–5)

## 2017-05-17 LAB — URINALYSIS, ROUTINE W REFLEX MICROSCOPIC
Bacteria, UA: NONE SEEN
Bilirubin Urine: NEGATIVE
GLUCOSE, UA: NEGATIVE mg/dL
HGB URINE DIPSTICK: NEGATIVE
Ketones, ur: NEGATIVE mg/dL
NITRITE: NEGATIVE
PH: 8 (ref 5.0–8.0)
Protein, ur: NEGATIVE mg/dL
SPECIFIC GRAVITY, URINE: 1.012 (ref 1.005–1.030)

## 2017-05-17 LAB — CORTISOL: CORTISOL PLASMA: 14 ug/dL

## 2017-05-17 LAB — PATHOLOGIST SMEAR REVIEW

## 2017-05-17 LAB — PROTEIN, CSF: Total  Protein, CSF: 28 mg/dL (ref 15–45)

## 2017-05-17 LAB — MRSA PCR SCREENING: MRSA by PCR: NEGATIVE

## 2017-05-17 MED ORDER — ONDANSETRON HCL 4 MG PO TABS: 4.0000 mg | ORAL_TABLET | Freq: Four times a day (QID) | ORAL | Status: DC | PRN

## 2017-05-17 MED ORDER — SODIUM CHLORIDE 0.9 % IV BOLUS (SEPSIS)
1000.0000 mL | Freq: Once | INTRAVENOUS | Status: AC
  Administered 2017-05-17: 1000 mL via INTRAVENOUS

## 2017-05-17 MED ORDER — ONDANSETRON HCL 4 MG/2ML IJ SOLN: 4.0000 mg | Freq: Four times a day (QID) | INTRAMUSCULAR | Status: DC | PRN

## 2017-05-17 MED ORDER — ACETAMINOPHEN 325 MG PO TABS: 650.0000 mg | ORAL_TABLET | Freq: Four times a day (QID) | ORAL | Status: DC | PRN

## 2017-05-17 MED ORDER — MORPHINE SULFATE (PF) 2 MG/ML IV SOLN
4.0000 mg | Freq: Once | INTRAVENOUS | Status: AC
  Administered 2017-05-17: 4 mg via INTRAVENOUS
  Filled 2017-05-17: qty 2

## 2017-05-17 MED ORDER — VANCOMYCIN HCL IN DEXTROSE 1-5 GM/200ML-% IV SOLN
1000.0000 mg | Freq: Once | INTRAVENOUS | Status: AC
  Administered 2017-05-17: 1000 mg via INTRAVENOUS
  Filled 2017-05-17: qty 200

## 2017-05-17 MED ORDER — PIPERACILLIN-TAZOBACTAM 3.375 G IVPB 30 MIN
3.3750 g | Freq: Once | INTRAVENOUS | Status: AC
  Administered 2017-05-17: 3.375 g via INTRAVENOUS
  Filled 2017-05-17: qty 50

## 2017-05-17 MED ORDER — SODIUM CHLORIDE 0.9 % IV BOLUS (SEPSIS)
500.0000 mL | Freq: Once | INTRAVENOUS | Status: AC
  Administered 2017-05-17: 500 mL via INTRAVENOUS

## 2017-05-17 MED ORDER — DOXYCYCLINE HYCLATE 100 MG IV SOLR
100.0000 mg | Freq: Two times a day (BID) | INTRAVENOUS | Status: DC
  Administered 2017-05-17 – 2017-05-18 (×2): 100 mg via INTRAVENOUS
  Filled 2017-05-17 (×2): qty 100

## 2017-05-17 MED ORDER — IBUPROFEN 200 MG PO TABS
600.0000 mg | ORAL_TABLET | Freq: Four times a day (QID) | ORAL | Status: DC | PRN
  Administered 2017-05-18 – 2017-05-19 (×2): 600 mg via ORAL
  Filled 2017-05-17 (×2): qty 3

## 2017-05-17 MED ORDER — SODIUM CHLORIDE 0.9 % IV SOLN
2.0000 g | INTRAVENOUS | Status: DC
  Administered 2017-05-17 (×3): 2 g via INTRAVENOUS
  Filled 2017-05-17 (×3): qty 2000

## 2017-05-17 MED ORDER — SODIUM CHLORIDE 0.9 % IV BOLUS (SEPSIS)
250.0000 mL | Freq: Once | INTRAVENOUS | Status: AC
  Administered 2017-05-17: 250 mL via INTRAVENOUS

## 2017-05-17 MED ORDER — DOXYCYCLINE HYCLATE 100 MG IV SOLR
100.0000 mg | Freq: Once | INTRAVENOUS | Status: DC
  Filled 2017-05-17: qty 100

## 2017-05-17 MED ORDER — PIPERACILLIN-TAZOBACTAM 3.375 G IVPB
3.3750 g | Freq: Three times a day (TID) | INTRAVENOUS | Status: DC
Start: 2017-05-17 — End: 2017-05-17
  Filled 2017-05-17: qty 50

## 2017-05-17 MED ORDER — ACETAMINOPHEN 650 MG RE SUPP: 650.0000 mg | Freq: Four times a day (QID) | RECTAL | Status: DC | PRN

## 2017-05-17 MED ORDER — SODIUM CHLORIDE 0.9 % IV SOLN
INTRAVENOUS | Status: DC
  Administered 2017-05-17: 05:00:00 via INTRAVENOUS

## 2017-05-17 MED ORDER — HYDROCORTISONE NA SUCCINATE PF 100 MG IJ SOLR
50.0000 mg | Freq: Four times a day (QID) | INTRAMUSCULAR | Status: DC
  Administered 2017-05-17 – 2017-05-18 (×5): 50 mg via INTRAVENOUS
  Filled 2017-05-17 (×5): qty 2

## 2017-05-17 MED ORDER — ACETAMINOPHEN 500 MG PO TABS
1000.0000 mg | ORAL_TABLET | Freq: Once | ORAL | Status: AC
  Administered 2017-05-17: 1000 mg via ORAL
  Filled 2017-05-17: qty 2

## 2017-05-17 MED ORDER — DOXYCYCLINE HYCLATE 100 MG IV SOLR
100.0000 mg | INTRAVENOUS | Status: AC
  Administered 2017-05-17: 100 mg via INTRAVENOUS
  Filled 2017-05-17: qty 100

## 2017-05-17 MED ORDER — SODIUM CHLORIDE 0.9 % IV SOLN
1000.0000 mL | INTRAVENOUS | Status: DC
  Administered 2017-05-17: 1000 mL via INTRAVENOUS

## 2017-05-17 MED ORDER — DEXTROSE 5 % IV SOLN
10.0000 mg/kg | Freq: Once | INTRAVENOUS | Status: AC
  Administered 2017-05-17: 700 mg via INTRAVENOUS
  Filled 2017-05-17: qty 14

## 2017-05-17 MED ORDER — LIDOCAINE HCL 1 % IJ SOLN
INTRAMUSCULAR | Status: AC
  Administered 2017-05-17: 05:00:00
  Filled 2017-05-17: qty 20

## 2017-05-17 MED ORDER — VANCOMYCIN HCL IN DEXTROSE 1-5 GM/200ML-% IV SOLN
1000.0000 mg | Freq: Two times a day (BID) | INTRAVENOUS | Status: DC
Start: 2017-05-17 — End: 2017-05-17

## 2017-05-17 MED ORDER — DEXTROSE 5 % IV SOLN
10.0000 mg/kg | Freq: Three times a day (TID) | INTRAVENOUS | Status: DC
  Administered 2017-05-17 – 2017-05-18 (×3): 700 mg via INTRAVENOUS
  Filled 2017-05-17 (×5): qty 14

## 2017-05-17 MED ORDER — DEXTROSE 5 % IV SOLN
2.0000 g | Freq: Two times a day (BID) | INTRAVENOUS | Status: DC
Start: 2017-05-17 — End: 2017-05-17
  Administered 2017-05-17: 2 g via INTRAVENOUS
  Filled 2017-05-17 (×2): qty 2

## 2017-05-17 NOTE — Progress Notes (Signed)
@IPLOG @        PROGRESS NOTE                                                                                                                                                                                                             Patient Demographics:    Ashley Bates, is a 56 y.o. female, DOB - 03-15-1961, UEA:[id number]  Admit date - 05/16/2017   Admitting Physician No admitting provider for patient encounter.  Outpatient Primary MD for the patient is Sampson Goon, Chrisandra Netters, MD  LOS - 0  Chief Complaint  Patient presents with  . Weakness  . Dizziness  . Nausea       Brief Narrative Ashley Bates is a 56 y.o. female with history of rheumatoid arthritis on methotrexate presents to the ER with complaints of fever chills and headache. Patient's symptoms have been present since yesterday morning. As per the patient patient recently moved into a new house 2 weeks ago and had been having lot of bug bites.   Subjective:    Ashley Bates today has, +ve mild Generalized headache, No chest pain, No abdominal pain - No Nausea, No new weakness tingling or numbness, No Cough - SOB.     Assessment  & Plan :     1.Fevers with headaches. Exam and CSF not consistent with bacterial meningitis or severe encephalitis, she has exposure to bug and mosquito bites recently. Currently we will keep her on doxycycline along with acyclovir, discussed the case with ID physician Dr. Ninetta Lights who agrees with the plan and will see the patient shortly. She appears nontoxic, no signs of meningismus does not have photophobia has chronic mild C and T-spine back pain for which she is following with neurosurgeon for the last 2-3 years. Continue supportive care.  Note rickettsial titers, HSV titers and CSF were all ordered and pending.  2. History of RA. on methotrexate which is on hold.  3. Hypotension. Patient does not look septic or toxic, this likely is due to dehydration and her being on steroids in the  past causing some relatively adrenal insufficiency. She will be hydrated adequately, has been started on stress dose steroids which will be continued for now.    Diet : Diet regular Room service appropriate? Yes; Fluid consistency: Thin    Family Communication  :  grandson  Code Status :  Full  Disposition Plan  :  TBD  Consults  :  ID  Procedures  :    LP - CSF unremarkable  CT  head. Nonacute  DVT Prophylaxis  :  SCDs    Lab Results  Component Value Date   PLT 157 05/17/2017    Inpatient Medications  Scheduled Meds: . hydrocortisone sod succinate (SOLU-CORTEF) inj  50 mg Intravenous Q6H   Continuous Infusions: . sodium chloride Stopped (05/17/17 0616)  . acyclovir    . doxycycline (VIBRAMYCIN) IV     PRN Meds:.acetaminophen **OR** acetaminophen, ondansetron **OR** ondansetron (ZOFRAN) IV  Antibiotics  :    Anti-infectives    Start     Dose/Rate Route Frequency Ordered Stop   05/17/17 1800  doxycycline (VIBRAMYCIN) 100 mg in dextrose 5 % 250 mL IVPB     100 mg 125 mL/hr over 120 Minutes Intravenous Every 12 hours 05/17/17 0344     05/17/17 1200  vancomycin (VANCOCIN) IVPB 1000 mg/200 mL premix  Status:  Discontinued     1,000 mg 200 mL/hr over 60 Minutes Intravenous Every 12 hours 05/17/17 0234 05/17/17 0906   05/17/17 1200  acyclovir (ZOVIRAX) 700 mg in dextrose 5 % 100 mL IVPB     10 mg/kg  70 kg 114 mL/hr over 60 Minutes Intravenous Every 8 hours 05/17/17 0401     05/17/17 0800  piperacillin-tazobactam (ZOSYN) IVPB 3.375 g  Status:  Discontinued     3.375 g 12.5 mL/hr over 240 Minutes Intravenous Every 8 hours 05/17/17 0234 05/17/17 0344   05/17/17 0600  cefTRIAXone (ROCEPHIN) 2 g in dextrose 5 % 50 mL IVPB  Status:  Discontinued     2 g 100 mL/hr over 30 Minutes Intravenous Every 12 hours 05/17/17 0401 05/17/17 0906   05/17/17 0600  doxycycline (VIBRAMYCIN) 100 mg in dextrose 5 % 250 mL IVPB     100 mg 125 mL/hr over 120 Minutes Intravenous NOW  05/17/17 0553 05/17/17 0820   05/17/17 0400  ampicillin (OMNIPEN) 2 g in sodium chloride 0.9 % 50 mL IVPB  Status:  Discontinued     2 g 150 mL/hr over 20 Minutes Intravenous Every 4 hours 05/17/17 0344 05/17/17 0906   05/17/17 0215  acyclovir (ZOVIRAX) 700 mg in dextrose 5 % 100 mL IVPB     10 mg/kg  70 kg 114 mL/hr over 60 Minutes Intravenous  Once 05/17/17 0205 05/17/17 0426   05/17/17 0030  piperacillin-tazobactam (ZOSYN) IVPB 3.375 g     3.375 g 100 mL/hr over 30 Minutes Intravenous  Once 05/17/17 0017 05/17/17 0237   05/17/17 0030  vancomycin (VANCOCIN) IVPB 1000 mg/200 mL premix     1,000 mg 200 mL/hr over 60 Minutes Intravenous  Once 05/17/17 0017 05/17/17 0329   05/17/17 0030  doxycycline (VIBRAMYCIN) 100 mg in dextrose 5 % 250 mL IVPB  Status:  Discontinued     100 mg 125 mL/hr over 120 Minutes Intravenous  Once 05/17/17 0017 05/17/17 0553         Objective:   Vitals:   05/17/17 0800 05/17/17 1000 05/17/17 1003 05/17/17 1030  BP: 95/62 100/69 100/69 97/65  Pulse: 73 76 73 74  Resp: 18 (!) 22 16 19   Temp:   97.7 F (36.5 C)   TempSrc:   Oral   SpO2: 96% 96% 96% 94%  Weight:      Height:        Wt Readings from Last 3 Encounters:  05/16/17 70 kg (154 lb 5.2 oz)  04/05/17 73 kg (161 lb)  03/25/17 73.5 kg (162 lb)     Intake/Output Summary (Last 24 hours) at 05/17/17 1121 Last data  filed at 05/17/17 0820  Gross per 24 hour  Intake             1600 ml  Output                0 ml  Net             1600 ml     Physical Exam  Awake Alert,   No new F.N deficits, Normal affect [place].AT,PERRAL Supple Neck,No JVD, No cervical lymphadenopathy appriciated.  Symmetrical Chest wall movement, Good air movement bilaterally, CTAB RRR,No Gallops,Rubs or new Murmurs, No Parasternal Heave +ve B.Sounds, Abd Soft, No tenderness, No organomegaly appriciated, No rebound - guarding or rigidity. No Cyanosis, Clubbing or edema, No new Rash or bruise       Data Review:     CBC  Recent Labs Lab 05/16/17 2247 05/17/17 0524  WBC 11.6* 10.2  HGB 13.5 10.9*  HCT 39.7 32.6*  PLT 185 157  MCV 89.6 90.3  MCH 30.5 30.2  MCHC 34.0 33.4  RDW 14.0 14.2  LYMPHSABS 1.4  --   MONOABS 0.5  --   EOSABS 0.0  --   BASOSABS 0.0  --     Chemistries   Recent Labs Lab 05/16/17 2247 05/16/17 2305 05/17/17 0524  NA 135  --  139  K 3.6  --  3.8  CL 103  --  112*  CO2 23  --  20*  GLUCOSE 113*  --  109*  BUN 14  --  12  CREATININE 0.84  --  0.72  CALCIUM 9.1  --  7.4*  AST  --  37  --   ALT  --  39  --   ALKPHOS  --  64  --   BILITOT  --  1.0  --    ------------------------------------------------------------------------------------------------------------------ No results for input(s): CHOL, HDL, LDLCALC, TRIG, CHOLHDL, LDLDIRECT in the last 72 hours.  No results found for: HGBA1C ------------------------------------------------------------------------------------------------------------------ No results for input(s): TSH, T4TOTAL, T3FREE, THYROIDAB in the last 72 hours.  Invalid input(s): FREET3 ------------------------------------------------------------------------------------------------------------------ No results for input(s): VITAMINB12, FOLATE, FERRITIN, TIBC, IRON, RETICCTPCT in the last 72 hours.  Coagulation profile No results for input(s): INR, PROTIME in the last 168 hours.  No results for input(s): DDIMER in the last 72 hours.  Cardiac Enzymes No results for input(s): CKMB, TROPONINI, MYOGLOBIN in the last 168 hours.  Invalid input(s): CK ------------------------------------------------------------------------------------------------------------------ No results found for: BNP  Micro Results Recent Results (from the past 240 hour(s))  Blood Culture (routine x 2)     Status: None (Preliminary result)   Collection Time: 05/17/17 12:17 AM  Result Value Ref Range Status   Specimen Description BLOOD LEFT ANTECUBITAL  Final    Special Requests   Final    BOTTLES DRAWN AEROBIC AND ANAEROBIC Blood Culture adequate volume Performed at [facility], [address], [place], [place] [address]    Culture PENDING  Incomplete   Report Status PENDING  Incomplete  CSF culture     Status: None (Preliminary result)   Collection Time: 05/17/17  2:13 AM  Result Value Ref Range Status   Specimen Description CSF  Final   Special Requests CSF  Final   Gram Stain   Final    NO WBC SEEN NO ORGANISMS SEEN Gram Stain Report Called to,Read Back By and Verified With: B.JESSEE,RN 0345 05/17/17 W.SHEA    Culture PENDING  Incomplete   Report Status PENDING  Incomplete    Radiology  Reports Dg Chest 2 View  Result Date: 05/17/2017 CLINICAL DATA:  Weakness and nausea. EXAM: CHEST  2 VIEW COMPARISON:  May 14, 2015 FINDINGS: The cardiomediastinal silhouette is stable. No pulmonary nodules or masses. No focal infiltrates. No acute abnormalities. IMPRESSION: No active cardiopulmonary disease. Electronically Signed   By: Gerome Sam III M.D   On: 05/17/2017 00:30   Ct Head Wo Contrast  Result Date: 05/17/2017 CLINICAL DATA:  56 y/o F; headache, dizziness, nausea, and weakness for 1 day. EXAM: CT HEAD WITHOUT CONTRAST TECHNIQUE: Contiguous axial images were obtained from the base of the skull through the vertex without intravenous contrast. COMPARISON:  None. FINDINGS: Brain: No evidence of acute infarction, hemorrhage, hydrocephalus, extra-axial collection or mass lesion/mass effect. Vascular: Mild calcific atherosclerosis of carotid siphons. Skull: Normal. Negative for fracture or focal lesion. Sinuses/Orbits: No acute finding. Other: None. IMPRESSION: No acute intracranial abnormality identified. Unremarkable CT of the head. Electronically Signed   By: Mitzi Hansen M.D.   On: 05/17/2017 00:44    Time Spent in minutes  30   Susa Raring M.D on 05/17/2017 at 11:21 AM  Between 7am to 7pm - Pager - [phone] ( page  via [email or web address], text pages only, please mention full 10 digit call back number). After 7pm go to [email or web address] - password [facility]

## 2017-05-17 NOTE — H&P (Addendum)
History and Physical    Punam Broussard GMW:[id number] DOB: Feb 14, 1961 DOA: 05/16/2017  PCP: Casey Burkitt, MD  Patient coming from: Home.  Patient's son provided Arabic translation.  Chief Complaint: Fever chills and headache.  HPI: Ashley Bates is a 56 y.o. female with history of rheumatoid arthritis on methotrexate presents to the ER with complaints of fever chills and headache. Patient's symptoms have been present since yesterday morning. As per the patient patient recently moved into a new house 2 weeks ago and had been having lot of bug bites. Denies any chest pain shortness of breath nausea vomiting diarrhea or any sick contacts. Patient recently moved from [place] to [place] 3 years ago. Has been on methotrexate for last 3 years.   ED Course: In the ER patient is found to be febrile with temperatures around 102F. Patient has neck rigidity. CT head is unremarkable. Patient underwent lumbar puncture. Blood cultures were ordered and patient was empirically started on antibiotics for possible meningitis and also since patient had bug bites doxycycline was started for empiric therapy for tick borne diseases.  Review of Systems: As per HPI, rest all negative.   Past Medical History:  Diagnosis Date  . Arthritis   . Back pain   . DDD (degenerative disc disease), cervical 10/06/2016  . DDD (degenerative disc disease), lumbar 10/06/2016  . 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 Post-surgical hypothyroidism   . Scoliosis   . Spinal stenosis   . Vitamin D deficiency 10/06/2016    Past Surgical History:  Procedure Laterality Date  . BACK SURGERY     x 3 (last in 2009)  . CARPAL TUNNEL RELEASE     2007  . LUMBAR DISC SURGERY    . THYROIDECTOMY     2006     reports that she has quit smoking. She has never used smokeless tobacco. She reports that she does not drink alcohol or use drugs.  No Known Allergies  Family History  Problem Relation Age of Onset    . Diabetes Neg Hx   . Hyperlipidemia Neg Hx   . Hypertension Neg Hx   . Heart failure Neg Hx     Prior to Admission medications   Medication Sig Start Date End Date Taking? Authorizing Provider  diclofenac sodium (VOLTAREN) 1 % GEL Apply 1 application topically 4 (four) times daily. Patient taking differently: Apply 1 application topically 4 (four) times daily as needed (pain).  05/04/16  Yes Mabe, Onalee Hua, NP  folic acid (FOLVITE) 1 MG tablet Take 2 tablets (2 mg total) by mouth daily. 12/24/16 03/19/18 Yes Panwala, Naitik, PA-C  methotrexate (RHEUMATREX) 2.5 MG tablet Take 8 tablets (20 mg total) by mouth once a week. Caution:Chemotherapy. Protect from light. 12/24/16  Yes Panwala, Naitik, PA-C  levothyroxine (SYNTHROID, LEVOTHROID) 25 MCG tablet Take 1 tablet (25 mcg total) by mouth daily before breakfast. khudh 1 qurs yawmiaan 30 daqiqat qabl al'iiftar. Patient not taking: Reported on 05/17/2017 04/09/17   Casey Burkitt, MD  polyethylene glycol powder [facility]) powder Take 17 g by mouth 2 (two) times daily as needed. Patient not taking: Reported on 10/07/2016 02/26/16   Latrelle Dodrill, MD  predniSONE (DELTASONE) 5 MG tablet 4po qAM x 5 days;3po qAM x 5 days,2po qAM x 5 days; 1po qAM x 5 days;1/2po qAM x 5 days;then stop. Patient not taking: Reported on 05/17/2017 12/24/16   Tawni Pummel, PA-C    Physical Exam: Vitals:   05/17/17 0200 05/17/17 0206  05/17/17 0215 05/17/17 0323  BP: (!) 94/57 (!) 94/57  (!) 90/53  Pulse: 86 80 92 85  Resp: (!) 23 19 (!) 21 (!) 22  Temp:      TempSrc:      SpO2: 92% 96% 98% 94%  Weight:      Height:          Constitutional: Moderately built and nourished. Vitals:   05/17/17 0200 05/17/17 0206 05/17/17 0215 05/17/17 0323  BP: (!) 94/57 (!) 94/57  (!) 90/53  Pulse: 86 80 92 85  Resp: (!) 23 19 (!) 21 (!) 22  Temp:      TempSrc:      SpO2: 92% 96% 98% 94%  Weight:      Height:       Eyes: Anicteric no pallor. ENMT: No  discharge from the ears eyes nose and mouth. Neck: Neck rigidity present. No mass felt. Respiratory: No rhonchi or crepitations. Cardiovascular: S1-S2 heard no murmurs appreciated. Abdomen: Soft nontender bowel sounds present. Musculoskeletal: No edema. No joint effusion. Skin: No rash. Skin appears warm. Neurologic: Alert awake oriented to time place and person. Moves all extremities. Psychiatric: Appears normal. Normal affect.   Labs on Admission: I have personally reviewed following labs and imaging studies  CBC:  Recent Labs Lab 05/16/17 2247  WBC 11.6*  NEUTROABS 9.6*  HGB 13.5  HCT 39.7  MCV 89.6  PLT 185   Basic Metabolic Panel:  Recent Labs Lab 05/16/17 2247  NA 135  K 3.6  CL 103  CO2 23  GLUCOSE 113*  BUN 14  CREATININE 0.84  CALCIUM 9.1   GFR: Estimated Creatinine Clearance: 69.3 mL/min (by C-G formula based on SCr of 0.84 mg/dL). Liver Function Tests:  Recent Labs Lab 05/16/17 2305  AST 37  ALT 39  ALKPHOS 64  BILITOT 1.0  PROT 8.4*  ALBUMIN 4.5   No results for input(s): LIPASE, AMYLASE in the last 168 hours. No results for input(s): AMMONIA in the last 168 hours. Coagulation Profile: No results for input(s): INR, PROTIME in the last 168 hours. Cardiac Enzymes: No results for input(s): CKTOTAL, CKMB, CKMBINDEX, TROPONINI in the last 168 hours. BNP (last 3 results) No results for input(s): PROBNP in the last 8760 hours. HbA1C: No results for input(s): HGBA1C in the last 72 hours. CBG: No results for input(s): GLUCAP in the last 168 hours. Lipid Profile: No results for input(s): CHOL, HDL, LDLCALC, TRIG, CHOLHDL, LDLDIRECT in the last 72 hours. Thyroid Function Tests: No results for input(s): TSH, T4TOTAL, FREET4, T3FREE, THYROIDAB in the last 72 hours. Anemia Panel: No results for input(s): VITAMINB12, FOLATE, FERRITIN, TIBC, IRON, RETICCTPCT in the last 72 hours. Urine analysis:    Component Value Date/Time   COLORURINE YELLOW  05/16/2017 2247   APPEARANCEUR CLEAR 05/16/2017 2247   LABSPEC 1.012 05/16/2017 2247   PHURINE 8.0 05/16/2017 2247   GLUCOSEU NEGATIVE 05/16/2017 2247   HGBUR NEGATIVE 05/16/2017 2247   BILIRUBINUR NEGATIVE 05/16/2017 2247   KETONESUR NEGATIVE 05/16/2017 2247   PROTEINUR NEGATIVE 05/16/2017 2247   UROBILINOGEN 0.2 02/20/2016 1947   NITRITE NEGATIVE 05/16/2017 2247   LEUKOCYTESUR TRACE (A) 05/16/2017 2247   Sepsis Labs: @LABRCNTIP (procalcitonin:4,lacticidven:4) )No results found for this or any previous visit (from the past 240 hour(s)).   Radiological Exams on Admission: Dg Chest 2 View  Result Date: 05/17/2017 CLINICAL DATA:  Weakness and nausea. EXAM: CHEST  2 VIEW COMPARISON:  May 14, 2015 FINDINGS: The cardiomediastinal silhouette is stable. No pulmonary nodules  or masses. No focal infiltrates. No acute abnormalities. IMPRESSION: No active cardiopulmonary disease. Electronically Signed   By: Gerome Sam III M.D   On: 05/17/2017 00:30   Ct Head Wo Contrast  Result Date: 05/17/2017 CLINICAL DATA:  56 y/o F; headache, dizziness, nausea, and weakness for 1 day. EXAM: CT HEAD WITHOUT CONTRAST TECHNIQUE: Contiguous axial images were obtained from the base of the skull through the vertex without intravenous contrast. COMPARISON:  None. FINDINGS: Brain: No evidence of acute infarction, hemorrhage, hydrocephalus, extra-axial collection or mass lesion/mass effect. Vascular: Mild calcific atherosclerosis of carotid siphons. Skull: Normal. Negative for fracture or focal lesion. Sinuses/Orbits: No acute finding. Other: None. IMPRESSION: No acute intracranial abnormality identified. Unremarkable CT of the head. Electronically Signed   By: Mitzi Hansen M.D.   On: 05/17/2017 00:44     Assessment/Plan Principal Problem:   SIRS (systemic inflammatory response syndrome) (HCC) Active Problems:   RA (rheumatoid arthritis) (HCC)    1. SIRS - given the headache and neck rigidity  suspect patient probably could have meningitis. Patient also had some bug bites for which tick borne diseases are suspected. Patient is placed on empiric antibiotics vancomycin and ceftriaxone and ampicillin and acyclovir and doxycycline.Follow cultures and Lyme and RMSF titers and also lumbar puncture labs which are pending.. Since patient's blood pressure has been remaining low in the high 80s systolic I have discussed with pulmonary critical care Dr. Kendrick Fries. I have ordered cortisol levels and started patient on stress dose steroids since patient has history of rheumatoid arthritis and possible use of steroids previously. Continue with hydration. If blood pressure does not improve with stress dose steroids patient may need Vasso pressors.  2. History of rheumatoid arthritis - on methotrexate which will be held for now since patient is having fever.   DVT prophylaxis: SCDs. Patient does have lumbar puncture. Code Status: Full code.  Family Communication: Patient's son.  Disposition Plan: Home.  Consults called: Discussed with critical care.  Admission status: Inpatient.    Eduard Clos MD [facility] Pager [phone].  If 7PM-7AM, please contact night-coverage [email or web address] Password [facility]  05/17/2017, 3:45 AM

## 2017-05-17 NOTE — Progress Notes (Signed)
Pharmacy Antibiotic Note  Ashley Bates is a 56 y.o. female c/o weakness, dizziness and nausea admitted on 05/16/2017 with meningitis.  Pharmacy has been consulted for rocephin, vancomycin and acyclovir dosing.  Plan: Rocephin 2 Gm IV q12h Acyclovir 700 mg IV q8h Vancomycin 1 Gm IV q12h VT=15-20 mg/L Ampicillin 2 Gm IV q4h (MD) Doxy 100 mg IV q12h (MD) F/u scr, cultures and levels  Height: 5' 2.99" (160 cm) Weight: 154 lb 5.2 oz (70 kg) IBW/kg (Calculated) : 52.38  Temp (24hrs), Avg:100.7 F (38.2 C), Min:99.1 F (37.3 C), Max:102.3 F (39.1 C)   Recent Labs Lab 05/16/17 2247 05/16/17 2306  WBC 11.6*  --   CREATININE 0.84  --   LATICACIDVEN  --  1.2    Estimated Creatinine Clearance: 69.3 mL/min (by C-G formula based on SCr of 0.84 mg/dL).    No Known Allergies  Antimicrobials this admission: 7/2 zosyn >> x1 ED 7/2 rocephin >> 7/2 acyclovir >> 7/2 vancomycin >>  7/2 doxy >>  Dose adjustments this admission:   Microbiology results:  BCx:   UCx:    Sputum:    MRSA PCR:   Thank you for allowing pharmacy to be a part of this patient's care.  Lorenza Evangelist 05/17/2017 12:26 AM

## 2017-05-17 NOTE — Consult Note (Signed)
[facility]  Date of Admission:  05/16/2017  Date of Consult:  05/17/2017  Reason for Consult: Fever Referring Physician: Candiss Norse  Impression/Recommendation Fever "bloody tap" Would continue her current rx while awaiting HSV PCR Would add enterovirus PCR (common in summer)  RA Steroid use last 6 months, methotrexate  Insect bites Would await RMSF Lyme not endemic to this area Would also check Ehrlichia  These seem less likely with normal WBC and PLT, normal LFTs  Thank you so much for this interesting consult,   Bobby Rumpf (pager) [phone] [email or web address]  Ashley Bates is an 56 y.o. female.  HPI: 56 yo F from [place] (in [place] 3 years), hx of RA ( on methotrexate), adm on 7-2 with 24h of f/c, headaches. She complained of multiple bug bites in ED.  In ED she ahd temp 102 and neck stiffness. She underwent LP that showed   Tube 1  Tube 4 WBC 17 (45%N) 0 RBC 237  4 Glc   58 Prot    28  She was started on broad anbx for meningitis, tapered to doxy/acyclovir after LP results available.   Past Medical History:  Diagnosis Date  . Arthritis   . Back pain   . DDD (degenerative disc disease), cervical 10/06/2016  . DDD (degenerative disc disease), lumbar 10/06/2016  . 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 Post-surgical hypothyroidism   . Scoliosis   . Spinal stenosis   . Vitamin D deficiency 10/06/2016    Past Surgical History:  Procedure Laterality Date  . BACK SURGERY     x 3 (last in 2009)  . CARPAL TUNNEL RELEASE     2007  . LUMBAR DISC SURGERY    . THYROIDECTOMY     2006     No Known Allergies  Medications:  Scheduled: . hydrocortisone sod succinate (SOLU-CORTEF) inj  50 mg Intravenous Q6H    Abtx:  Anti-infectives    Start     Dose/Rate Route Frequency Ordered Stop   05/17/17 1800  doxycycline (VIBRAMYCIN) 100 mg in dextrose 5 % 250 mL IVPB     100 mg 125 mL/hr over 120 Minutes Intravenous  Every 12 hours 05/17/17 0344     05/17/17 1200  vancomycin (VANCOCIN) IVPB 1000 mg/200 mL premix  Status:  Discontinued     1,000 mg 200 mL/hr over 60 Minutes Intravenous Every 12 hours 05/17/17 0234 05/17/17 0906   05/17/17 1200  acyclovir (ZOVIRAX) 700 mg in dextrose 5 % 100 mL IVPB     10 mg/kg  70 kg 114 mL/hr over 60 Minutes Intravenous Every 8 hours 05/17/17 0401     05/17/17 0800  piperacillin-tazobactam (ZOSYN) IVPB 3.375 g  Status:  Discontinued     3.375 g 12.5 mL/hr over 240 Minutes Intravenous Every 8 hours 05/17/17 0234 05/17/17 0344   05/17/17 0600  cefTRIAXone (ROCEPHIN) 2 g in dextrose 5 % 50 mL IVPB  Status:  Discontinued     2 g 100 mL/hr over 30 Minutes Intravenous Every 12 hours 05/17/17 0401 05/17/17 0906   05/17/17 0600  doxycycline (VIBRAMYCIN) 100 mg in dextrose 5 % 250 mL IVPB     100 mg 125 mL/hr over 120 Minutes Intravenous NOW 05/17/17 0553 05/17/17 0820   05/17/17 0400  ampicillin (OMNIPEN) 2 g in sodium chloride 0.9 % 50 mL IVPB  Status:  Discontinued     2 g 150 mL/hr over 20 Minutes Intravenous Every 4 hours 05/17/17 0344  05/17/17 0906   05/17/17 0215  acyclovir (ZOVIRAX) 700 mg in dextrose 5 % 100 mL IVPB     10 mg/kg  70 kg 114 mL/hr over 60 Minutes Intravenous  Once 05/17/17 0205 05/17/17 1259   05/17/17 0030  piperacillin-tazobactam (ZOSYN) IVPB 3.375 g     3.375 g 100 mL/hr over 30 Minutes Intravenous  Once 05/17/17 0017 05/17/17 1258   05/17/17 0030  vancomycin (VANCOCIN) IVPB 1000 mg/200 mL premix     1,000 mg 200 mL/hr over 60 Minutes Intravenous  Once 05/17/17 0017 05/17/17 1259   05/17/17 0030  doxycycline (VIBRAMYCIN) 100 mg in dextrose 5 % 250 mL IVPB  Status:  Discontinued     100 mg 125 mL/hr over 120 Minutes Intravenous  Once 05/17/17 0017 05/17/17 0553      Total days of antibiotics: 1 doxy/acyclovir          Social History:  reports that she has quit smoking. She has never used smokeless tobacco. She reports that she does not  drink alcohol or use drugs.  Family History  Problem Relation Age of Onset  . Diabetes Neg Hx   . Hyperlipidemia Neg Hx   . Hypertension Neg Hx   . Heart failure Neg Hx     History obtained from chart review, the patient and interpretter General ROS: +headache/heaviness, no photophobia, +constipation, no dysuria, no rash, +myalgias,  Denies sick exposures. Denies hx of TB exposure.  Please see HPI. 12 point ROS o/w (-)  Blood pressure 109/62, pulse 73, temperature 97.5 F (36.4 C), temperature source Oral, resp. rate (!) 21, height 5' 2.99" (1.6 m), weight 70 kg (154 lb 5.2 oz), SpO2 95 %. General appearance: alert, cooperative and no distress Eyes: negative findings: conjunctivae and sclerae normal, pupils equal, round, reactive to light and accomodation and no photophobia Throat: normal findings: lips normal without lesions, oropharynx pink & moist without lesions or evidence of thrush and dentures Neck: no adenopathy, supple, symmetrical, trachea midline and no meningismus Lungs: clear to auscultation bilaterally Heart: regular rate and rhythm Abdomen: normal findings: bowel sounds normal and soft, non-tender Extremities: edema none and no LE cordis or muscle tenderness. no joint swelling or tenderness.  Skin: no rash. scattered sites of bug bites. no cellulitis Neurologic: Mental status: Alert, oriented, thought content appropriate Cranial nerves: normal Motor: grossly normal   Results for orders placed or performed during the hospital encounter of 05/16/17 (from the past 48 hour(s))  Basic metabolic panel     Status: Abnormal   Collection Time: 05/16/17 10:47 PM  Result Value Ref Range   Sodium 135 135 - 145 mmol/L   Potassium 3.6 3.5 - 5.1 mmol/L   Chloride 103 101 - 111 mmol/L   CO2 23 22 - 32 mmol/L   Glucose, Bld 113 (H) 65 - 99 mg/dL   BUN 14 6 - 20 mg/dL   Creatinine, Ser 0.84 0.44 - 1.00 mg/dL   Calcium 9.1 8.9 - 10.3 mg/dL   GFR calc non Af Amer >60 >60 mL/min     GFR calc Af Amer >60 >60 mL/min    Comment: (NOTE) The eGFR has been calculated using the CKD EPI equation. This calculation has not been validated in all clinical situations. eGFR's persistently <60 mL/min signify possible Chronic Kidney Disease.    Anion gap 9 5 - 15  CBC     Status: Abnormal   Collection Time: 05/16/17 10:47 PM  Result Value Ref Range   WBC 11.6 (H)  4.0 - 10.5 K/uL   RBC 4.43 3.87 - 5.11 MIL/uL   Hemoglobin 13.5 12.0 - 15.0 g/dL   HCT 39.7 36.0 - 46.0 %   MCV 89.6 78.0 - 100.0 fL   MCH 30.5 26.0 - 34.0 pg   MCHC 34.0 30.0 - 36.0 g/dL   RDW 14.0 11.5 - 15.5 %   Platelets 185 150 - 400 K/uL  Urinalysis, Routine w reflex microscopic     Status: Abnormal   Collection Time: 05/16/17 10:47 PM  Result Value Ref Range   Color, Urine YELLOW YELLOW   APPearance CLEAR CLEAR   Specific Gravity, Urine 1.012 1.005 - 1.030   pH 8.0 5.0 - 8.0   Glucose, UA NEGATIVE NEGATIVE mg/dL   Hgb urine dipstick NEGATIVE NEGATIVE   Bilirubin Urine NEGATIVE NEGATIVE   Ketones, ur NEGATIVE NEGATIVE mg/dL   Protein, ur NEGATIVE NEGATIVE mg/dL   Nitrite NEGATIVE NEGATIVE   Leukocytes, UA TRACE (A) NEGATIVE   RBC / HPF 0-5 0 - 5 RBC/hpf   WBC, UA 0-5 0 - 5 WBC/hpf   Bacteria, UA NONE SEEN NONE SEEN   Squamous Epithelial / LPF 0-5 (A) NONE SEEN   Mucous PRESENT   Differential     Status: Abnormal   Collection Time: 05/16/17 10:47 PM  Result Value Ref Range   Neutrophils Relative % 84 %   Neutro Abs 9.6 (H) 1.7 - 7.7 K/uL   Lymphocytes Relative 12 %   Lymphs Abs 1.4 0.7 - 4.0 K/uL   Monocytes Relative 4 %   Monocytes Absolute 0.5 0.1 - 1.0 K/uL   Eosinophils Relative 0 %   Eosinophils Absolute 0.0 0.0 - 0.7 K/uL   Basophils Relative 0 %   Basophils Absolute 0.0 0.0 - 0.1 K/uL  Hepatic function panel     Status: Abnormal   Collection Time: 05/16/17 11:05 PM  Result Value Ref Range   Total Protein 8.4 (H) 6.5 - 8.1 g/dL   Albumin 4.5 3.5 - 5.0 g/dL   AST 37 15 - 41 U/L    ALT 39 14 - 54 U/L   Alkaline Phosphatase 64 38 - 126 U/L   Total Bilirubin 1.0 0.3 - 1.2 mg/dL   Bilirubin, Direct 0.1 0.1 - 0.5 mg/dL   Indirect Bilirubin 0.9 0.3 - 0.9 mg/dL  Lactic acid, plasma     Status: None   Collection Time: 05/16/17 11:06 PM  Result Value Ref Range   Lactic Acid, Venous 1.2 0.5 - 1.9 mmol/L  I-stat troponin, ED     Status: None   Collection Time: 05/16/17 11:50 PM  Result Value Ref Range   Troponin i, poc 0.00 0.00 - 0.08 ng/mL   Comment 3            Comment: Due to the release kinetics of cTnI, a negative result within the first hours of the onset of symptoms does not rule out myocardial infarction with certainty. If myocardial infarction is still suspected, repeat the test at appropriate intervals.   Blood Culture (routine x 2)     Status: None (Preliminary result)   Collection Time: 05/17/17 12:17 AM  Result Value Ref Range   Specimen Description BLOOD LEFT ANTECUBITAL    Special Requests      BOTTLES DRAWN AEROBIC AND ANAEROBIC Blood Culture adequate volume Performed at [facility] [address], [place], [place] [address]    Culture PENDING    Report Status PENDING   CSF cell count with differential  collection tube #: 1     Status: Abnormal   Collection Time: 05/17/17  2:13 AM  Result Value Ref Range   Tube # 1    Color, CSF CLEAR (A) COLORLESS   Appearance, CSF COLORLESS (A) CLEAR   Supernatant NOT INDICATED    RBC Count, CSF 237 (H) 0 /cu mm   WBC, CSF 17 (HH) 0 - 5 /cu mm    Comment: CRITICAL RESULT CALLED TO, READ BACK BY AND VERIFIED WITH: B.JESSEE,RN 0345 05/17/17 W.SHEA    Segmented Neutrophils-CSF 45 (H) 0 - 6 %   Lymphs, CSF 47 40 - 80 %   Monocyte-Macrophage-Spinal Fluid 7 (L) 15 - 45 %   Eosinophils, CSF 1 0 - 1 %   Other Cells, CSF PENDING PATHOLOGIST REVIEW   CSF cell count with differential collection tube #: 4     Status: Abnormal   Collection Time: 05/17/17  2:13 AM  Result Value Ref Range   Tube # 4    Color,  CSF CLEAR (A) COLORLESS   Appearance, CSF COLORLESS (A) CLEAR   Supernatant NOT INDICATED    RBC Count, CSF 4 (H) 0 /cu mm   WBC, CSF 0 0 - 5 /cu mm   Segmented Neutrophils-CSF RARE 0 - 6 %   Lymphs, CSF RARE 40 - 80 %   Other Cells, CSF TOO FEW TO COUNT, SMEAR AVAILABLE FOR REVIEW   CSF culture     Status: None (Preliminary result)   Collection Time: 05/17/17  2:13 AM  Result Value Ref Range   Specimen Description CSF    Special Requests CSF    Gram Stain      NO WBC SEEN NO ORGANISMS SEEN Gram Stain Report Called to,Read Back By and Verified With: B.JESSEE,RN 0345 05/17/17 W.SHEA    Culture PENDING    Report Status PENDING   Glucose, CSF     Status: None   Collection Time: 05/17/17  2:13 AM  Result Value Ref Range   Glucose, CSF 58 40 - 70 mg/dL  Protein, CSF     Status: None   Collection Time: 05/17/17  2:13 AM  Result Value Ref Range   Total  Protein, CSF 28 15 - 45 mg/dL  Lactic acid, plasma     Status: None   Collection Time: 05/17/17  2:20 AM  Result Value Ref Range   Lactic Acid, Venous 0.9 0.5 - 1.9 mmol/L  Cortisol     Status: None   Collection Time: 05/17/17  3:42 AM  Result Value Ref Range   Cortisol, Plasma 14.0 ug/dL    Comment: (NOTE) AM    6.7 - 22.6 ug/dL PM   <10.0       ug/dL Performed at [facility], [address], [place], [place] [address]   Basic metabolic panel     Status: Abnormal   Collection Time: 05/17/17  5:24 AM  Result Value Ref Range   Sodium 139 135 - 145 mmol/L   Potassium 3.8 3.5 - 5.1 mmol/L   Chloride 112 (H) 101 - 111 mmol/L   CO2 20 (L) 22 - 32 mmol/L   Glucose, Bld 109 (H) 65 - 99 mg/dL   BUN 12 6 - 20 mg/dL   Creatinine, Ser 0.72 0.44 - 1.00 mg/dL   Calcium 7.4 (L) 8.9 - 10.3 mg/dL   GFR calc non Af Amer >60 >60 mL/min   GFR calc Af Amer >60 >60 mL/min    Comment: (NOTE)  The eGFR has been calculated using the CKD EPI equation. This calculation has not been validated in all clinical situations. eGFR's persistently  <60 mL/min signify possible Chronic Kidney Disease.    Anion gap 7 5 - 15  CBC     Status: Abnormal   Collection Time: 05/17/17  5:24 AM  Result Value Ref Range   WBC 10.2 4.0 - 10.5 K/uL   RBC 3.61 (L) 3.87 - 5.11 MIL/uL   Hemoglobin 10.9 (L) 12.0 - 15.0 g/dL   HCT 32.6 (L) 36.0 - 46.0 %   MCV 90.3 78.0 - 100.0 fL   MCH 30.2 26.0 - 34.0 pg   MCHC 33.4 30.0 - 36.0 g/dL   RDW 14.2 11.5 - 15.5 %   Platelets 157 150 - 400 K/uL  Procalcitonin - Baseline     Status: None   Collection Time: 05/17/17  5:24 AM  Result Value Ref Range   Procalcitonin 0.10 ng/mL    Comment:        Interpretation: PCT (Procalcitonin) <= 0.5 ng/mL: Systemic infection (sepsis) is not likely. Local bacterial infection is possible. (NOTE)         ICU PCT Algorithm               Non ICU PCT Algorithm    ----------------------------     ------------------------------         PCT < 0.25 ng/mL                 PCT < 0.1 ng/mL     Stopping of antibiotics            Stopping of antibiotics       strongly encouraged.               strongly encouraged.    ----------------------------     ------------------------------       PCT level decrease by               PCT < 0.25 ng/mL       >= 80% from peak PCT       OR PCT 0.25 - 0.5 ng/mL          Stopping of antibiotics                                             encouraged.     Stopping of antibiotics           encouraged.    ----------------------------     ------------------------------       PCT level decrease by              PCT >= 0.25 ng/mL       < 80% from peak PCT        AND PCT >= 0.5 ng/mL            Continuin g antibiotics                                              encouraged.       Continuing antibiotics            encouraged.    ----------------------------     ------------------------------     PCT level increase compared  PCT > 0.5 ng/mL         with peak PCT AND          PCT >= 0.5 ng/mL             Escalation of antibiotics                                           strongly encouraged.      Escalation of antibiotics        strongly encouraged.   Lactic acid, plasma     Status: None   Collection Time: 05/17/17  5:24 AM  Result Value Ref Range   Lactic Acid, Venous 1.7 0.5 - 1.9 mmol/L  Pathologist smear review     Status: None   Collection Time: 05/17/17  6:30 AM  Result Value Ref Range   Path Review Reviewed By Violet Baldy, M.D.     Comment: 7.2.18 BONE MARROW CONTAMINATION.    MRSA PCR Screening     Status: None   Collection Time: 05/17/17 12:46 PM  Result Value Ref Range   MRSA by PCR NEGATIVE NEGATIVE    Comment:        The GeneXpert MRSA Assay (FDA approved for NASAL specimens only), is one component of a comprehensive MRSA colonization surveillance program. It is not intended to diagnose MRSA infection nor to guide or monitor treatment for MRSA infections.       Component Value Date/Time   SDES CSF 05/17/2017 0213   SPECREQUEST CSF 05/17/2017 0213   CULT PENDING 05/17/2017 0213   REPTSTATUS PENDING 05/17/2017 0213   Dg Chest 2 View  Result Date: 05/17/2017 CLINICAL DATA:  Weakness and nausea. EXAM: CHEST  2 VIEW COMPARISON:  May 14, 2015 FINDINGS: The cardiomediastinal silhouette is stable. No pulmonary nodules or masses. No focal infiltrates. No acute abnormalities. IMPRESSION: No active cardiopulmonary disease. Electronically Signed   By: Dorise Bullion III M.D   On: 05/17/2017 00:30   Ct Head Wo Contrast  Result Date: 05/17/2017 CLINICAL DATA:  56 y/o F; headache, dizziness, nausea, and weakness for 1 day. EXAM: CT HEAD WITHOUT CONTRAST TECHNIQUE: Contiguous axial images were obtained from the base of the skull through the vertex without intravenous contrast. COMPARISON:  None. FINDINGS: Brain: No evidence of acute infarction, hemorrhage, hydrocephalus, extra-axial collection or mass lesion/mass effect. Vascular: Mild calcific atherosclerosis of carotid siphons. Skull: Normal. Negative for  fracture or focal lesion. Sinuses/Orbits: No acute finding. Other: None. IMPRESSION: No acute intracranial abnormality identified. Unremarkable CT of the head. Electronically Signed   By: Kristine Garbe M.D.   On: 05/17/2017 00:44   Recent Results (from the past 240 hour(s))  Blood Culture (routine x 2)     Status: None (Preliminary result)   Collection Time: 05/17/17 12:17 AM  Result Value Ref Range Status   Specimen Description BLOOD LEFT ANTECUBITAL  Final   Special Requests   Final    BOTTLES DRAWN AEROBIC AND ANAEROBIC Blood Culture adequate volume Performed at [facility] [address] [place], [place] [address]    Culture PENDING  Incomplete   Report Status PENDING  Incomplete  CSF culture     Status: None (Preliminary result)   Collection Time: 05/17/17  2:13 AM  Result Value Ref Range Status   Specimen Description CSF  Final   Special Requests CSF  Final  Gram Stain   Final    NO WBC SEEN NO ORGANISMS SEEN Gram Stain Report Called to,Read Back By and Verified With: B.JESSEE,RN 0345 05/17/17 W.SHEA    Culture PENDING  Incomplete   Report Status PENDING  Incomplete  MRSA PCR Screening     Status: None   Collection Time: 05/17/17 12:46 PM  Result Value Ref Range Status   MRSA by PCR NEGATIVE NEGATIVE Final    Comment:        The GeneXpert MRSA Assay (FDA approved for NASAL specimens only), is one component of a comprehensive MRSA colonization surveillance program. It is not intended to diagnose MRSA infection nor to guide or monitor treatment for MRSA infections.       05/17/2017, 3:40 PM     LOS: 0 days    Records and images were personally reviewed where available.

## 2017-05-18 DIAGNOSIS — R509 Fever, unspecified: Secondary | ICD-10-CM

## 2017-05-18 LAB — URINE CULTURE: Culture: NO GROWTH

## 2017-05-18 LAB — B. BURGDORFI ANTIBODIES

## 2017-05-18 MED ORDER — DOXYCYCLINE HYCLATE 100 MG PO TABS
100.0000 mg | ORAL_TABLET | Freq: Two times a day (BID) | ORAL | Status: DC
  Administered 2017-05-18 – 2017-05-19 (×3): 100 mg via ORAL
  Filled 2017-05-18 (×3): qty 1

## 2017-05-18 MED ORDER — DOCUSATE SODIUM 100 MG PO CAPS
200.0000 mg | ORAL_CAPSULE | Freq: Two times a day (BID) | ORAL | Status: DC
  Administered 2017-05-18 – 2017-05-19 (×3): 200 mg via ORAL
  Filled 2017-05-18 (×3): qty 2

## 2017-05-18 MED ORDER — HYDROCORTISONE NA SUCCINATE PF 100 MG IJ SOLR
50.0000 mg | Freq: Every day | INTRAMUSCULAR | Status: DC
  Administered 2017-05-19: 50 mg via INTRAVENOUS
  Filled 2017-05-18: qty 2

## 2017-05-18 MED ORDER — VALACYCLOVIR HCL 500 MG PO TABS
1000.0000 mg | ORAL_TABLET | Freq: Two times a day (BID) | ORAL | Status: DC
  Administered 2017-05-18 – 2017-05-19 (×3): 1000 mg via ORAL
  Filled 2017-05-18 (×3): qty 2

## 2017-05-18 MED ORDER — BISACODYL 5 MG PO TBEC
10.0000 mg | DELAYED_RELEASE_TABLET | Freq: Once | ORAL | Status: AC
  Administered 2017-05-18: 10 mg via ORAL
  Filled 2017-05-18: qty 2

## 2017-05-18 NOTE — Progress Notes (Signed)
@IPLOG @        PROGRESS NOTE                                                                                                                                                                                                             Patient Demographics:    Ashley Bates, is a 56 y.o. female, DOB - [birth year]  Admit date - 05/16/2017   Admitting Physician 07/17/2017, MD  Outpatient Primary MD for the patient is Ashley Clos, MD  LOS - 1  Chief Complaint  Patient presents with  . Weakness  . Dizziness  . Nausea       Brief Narrative Ashley Bates is a 56 y.o. female with history of rheumatoid arthritis on methotrexate presents to the ER with complaints of fever chills and headache. Patient's symptoms have been present since yesterday morning. As per the patient patient recently moved into a new house 2 weeks ago and had been having lot of bug bites.   Subjective:   Patient in bed, appears comfortable, denies any headache, no fever, no chest pain or pressure, no shortness of breath , no abdominal pain. No focal weakness.     Assessment  & Plan :     1.Fevers with headaches. Exam and CSF not consistent with bacterial meningitis or severe encephalitis, she has exposure to bug and mosquito bites recently. Rickettsial serology and HSV PCR are pending, ID following and case discussed with Dr. 58, she will be transitioned down to oral doc seen Valtrex. Weight 4 HSV to be negative then she can be discharged home. Clinically she is much better will move her out of stepdown.   2. History of RA. She is on methotrexate at home which is on hold due to #1 above.  3. Hypotension. Patient does not look septic or toxic, this likely is due to dehydration and her being on steroids in the past causing some relatively adrenal insufficiency. She has been adequately hydrated and was placed on stress dose steroids on admission, blood pressure is improving  start tapering steroids rapidly.    Diet : Diet regular Room service appropriate? Yes; Fluid consistency: Thin    Family Communication  :  grandson  Code Status :  Full  Disposition Plan  :  TBD  Consults  :  ID  Procedures  :    LP - CSF unremarkable  CT head. Nonacute  DVT Prophylaxis  :  SCDs    Lab Results  Component Value Date  PLT 157 05/17/2017    Inpatient Medications  Scheduled Meds: . hydrocortisone sod succinate (SOLU-CORTEF) inj  50 mg Intravenous Q6H   Continuous Infusions: . acyclovir Stopped (05/18/17 0630)  . doxycycline (VIBRAMYCIN) IV Stopped (05/18/17 9629)   PRN Meds:.acetaminophen **OR** [DISCONTINUED] acetaminophen, ibuprofen, ondansetron **OR** [DISCONTINUED] ondansetron (ZOFRAN) IV  Antibiotics  :    Anti-infectives    Start     Dose/Rate Route Frequency Ordered Stop   05/17/17 1800  doxycycline (VIBRAMYCIN) 100 mg in dextrose 5 % 250 mL IVPB     100 mg 125 mL/hr over 120 Minutes Intravenous Every 12 hours 05/17/17 0344     05/17/17 1200  vancomycin (VANCOCIN) IVPB 1000 mg/200 mL premix  Status:  Discontinued     1,000 mg 200 mL/hr over 60 Minutes Intravenous Every 12 hours 05/17/17 0234 05/17/17 0906   05/17/17 1200  acyclovir (ZOVIRAX) 700 mg in dextrose 5 % 100 mL IVPB     10 mg/kg  70 kg 114 mL/hr over 60 Minutes Intravenous Every 8 hours 05/17/17 0401     05/17/17 0800  piperacillin-tazobactam (ZOSYN) IVPB 3.375 g  Status:  Discontinued     3.375 g 12.5 mL/hr over 240 Minutes Intravenous Every 8 hours 05/17/17 0234 05/17/17 0344   05/17/17 0600  cefTRIAXone (ROCEPHIN) 2 g in dextrose 5 % 50 mL IVPB  Status:  Discontinued     2 g 100 mL/hr over 30 Minutes Intravenous Every 12 hours 05/17/17 0401 05/17/17 0906   05/17/17 0600  doxycycline (VIBRAMYCIN) 100 mg in dextrose 5 % 250 mL IVPB     100 mg 125 mL/hr over 120 Minutes Intravenous NOW 05/17/17 0553 05/17/17 0820   05/17/17 0400  ampicillin (OMNIPEN) 2 g in sodium chloride  0.9 % 50 mL IVPB  Status:  Discontinued     2 g 150 mL/hr over 20 Minutes Intravenous Every 4 hours 05/17/17 0344 05/17/17 0906   05/17/17 0215  acyclovir (ZOVIRAX) 700 mg in dextrose 5 % 100 mL IVPB     10 mg/kg  70 kg 114 mL/hr over 60 Minutes Intravenous  Once 05/17/17 0205 05/17/17 1259   05/17/17 0030  piperacillin-tazobactam (ZOSYN) IVPB 3.375 g     3.375 g 100 mL/hr over 30 Minutes Intravenous  Once 05/17/17 0017 05/17/17 1258   05/17/17 0030  vancomycin (VANCOCIN) IVPB 1000 mg/200 mL premix     1,000 mg 200 mL/hr over 60 Minutes Intravenous  Once 05/17/17 0017 05/17/17 1259   05/17/17 0030  doxycycline (VIBRAMYCIN) 100 mg in dextrose 5 % 250 mL IVPB  Status:  Discontinued     100 mg 125 mL/hr over 120 Minutes Intravenous  Once 05/17/17 0017 05/17/17 0553         Objective:   Vitals:   05/18/17 0304 05/18/17 0600 05/18/17 0700 05/18/17 0800  BP:  (!) 114/53 124/71 121/61  Pulse:  74 75 69  Resp:  18 (!) 28 19  Temp: 98.3 F (36.8 C)  97.8 F (36.6 C)   TempSrc: Oral  Oral   SpO2:  95% 94% 95%  Weight:      Height:        Wt Readings from Last 3 Encounters:  05/16/17 70 kg (154 lb 5.2 oz)  04/05/17 73 kg (161 lb)  03/25/17 73.5 kg (162 lb)     Intake/Output Summary (Last 24 hours) at 05/18/17 0935 Last data filed at 05/18/17 0800  Gross per 24 hour  Intake  3077.42 ml  Output             2550 ml  Net           527.42 ml     Physical Exam  Awake Alert, Oriented X 3, No new F.N deficits, Normal affect [place].AT,PERRAL Supple Neck,No JVD, No cervical lymphadenopathy appriciated.  Symmetrical Chest wall movement, Good air movement bilaterally, CTAB RRR,No Gallops,Rubs or new Murmurs, No Parasternal Heave +ve B.Sounds, Abd Soft, No tenderness, No organomegaly appriciated, No rebound - guarding or rigidity. No Cyanosis, Clubbing or edema, No new Rash or bruise   Data Review:    CBC  Recent Labs Lab 05/16/17 2247 05/17/17 0524  WBC 11.6* 10.2   HGB 13.5 10.9*  HCT 39.7 32.6*  PLT 185 157  MCV 89.6 90.3  MCH 30.5 30.2  MCHC 34.0 33.4  RDW 14.0 14.2  LYMPHSABS 1.4  --   MONOABS 0.5  --   EOSABS 0.0  --   BASOSABS 0.0  --     Chemistries   Recent Labs Lab 05/16/17 2247 05/16/17 2305 05/17/17 0524  NA 135  --  139  K 3.6  --  3.8  CL 103  --  112*  CO2 23  --  20*  GLUCOSE 113*  --  109*  BUN 14  --  12  CREATININE 0.84  --  0.72  CALCIUM 9.1  --  7.4*  AST  --  37  --   ALT  --  39  --   ALKPHOS  --  64  --   BILITOT  --  1.0  --    ------------------------------------------------------------------------------------------------------------------ No results for input(s): CHOL, HDL, LDLCALC, TRIG, CHOLHDL, LDLDIRECT in the last 72 hours.  No results found for: HGBA1C ------------------------------------------------------------------------------------------------------------------ No results for input(s): TSH, T4TOTAL, T3FREE, THYROIDAB in the last 72 hours.  Invalid input(s): FREET3 ------------------------------------------------------------------------------------------------------------------ No results for input(s): VITAMINB12, FOLATE, FERRITIN, TIBC, IRON, RETICCTPCT in the last 72 hours.  Coagulation profile No results for input(s): INR, PROTIME in the last 168 hours.  No results for input(s): DDIMER in the last 72 hours.  Cardiac Enzymes No results for input(s): CKMB, TROPONINI, MYOGLOBIN in the last 168 hours.  Invalid input(s): CK ------------------------------------------------------------------------------------------------------------------ No results found for: BNP  Micro Results Recent Results (from the past 240 hour(s))  Blood Culture (routine x 2)     Status: None (Preliminary result)   Collection Time: 05/17/17 12:17 AM  Result Value Ref Range Status   Specimen Description BLOOD LEFT ANTECUBITAL  Final   Special Requests   Final    BOTTLES DRAWN AEROBIC AND ANAEROBIC Blood Culture  adequate volume Performed at [facility], [address], [place], [place] [address]    Culture PENDING  Incomplete   Report Status PENDING  Incomplete  Urine culture     Status: None   Collection Time: 05/17/17 12:17 AM  Result Value Ref Range Status   Specimen Description URINE, RANDOM  Final   Special Requests NONE  Final   Culture   Final    NO GROWTH Performed at [facility], [address], [place], [place] [address]    Report Status 05/18/2017 FINAL  Final  CSF culture     Status: None (Preliminary result)   Collection Time: 05/17/17  2:13 AM  Result Value Ref Range Status   Specimen Description CSF  Final   Special Requests CSF  Final   Gram Stain   Final  NO WBC SEEN NO ORGANISMS SEEN Gram Stain Report Called to,Read Back By and Verified With: B.JESSEE,RN 0345 05/17/17 W.SHEA    Culture PENDING  Incomplete   Report Status PENDING  Incomplete  MRSA PCR Screening     Status: None   Collection Time: 05/17/17 12:46 PM  Result Value Ref Range Status   MRSA by PCR NEGATIVE NEGATIVE Final    Comment:        The GeneXpert MRSA Assay (FDA approved for NASAL specimens only), is one component of a comprehensive MRSA colonization surveillance program. It is not intended to diagnose MRSA infection nor to guide or monitor treatment for MRSA infections.     Radiology Reports Dg Chest 2 View  Result Date: 05/17/2017 CLINICAL DATA:  Weakness and nausea. EXAM: CHEST  2 VIEW COMPARISON:  May 14, 2015 FINDINGS: The cardiomediastinal silhouette is stable. No pulmonary nodules or masses. No focal infiltrates. No acute abnormalities. IMPRESSION: No active cardiopulmonary disease. Electronically Signed   By: Gerome Sam III M.D   On: 05/17/2017 00:30   Ct Head Wo Contrast  Result Date: 05/17/2017 CLINICAL DATA:  56 y/o F; headache, dizziness, nausea, and weakness for 1 day. EXAM: CT HEAD WITHOUT CONTRAST TECHNIQUE: Contiguous axial images were obtained from the  base of the skull through the vertex without intravenous contrast. COMPARISON:  None. FINDINGS: Brain: No evidence of acute infarction, hemorrhage, hydrocephalus, extra-axial collection or mass lesion/mass effect. Vascular: Mild calcific atherosclerosis of carotid siphons. Skull: Normal. Negative for fracture or focal lesion. Sinuses/Orbits: No acute finding. Other: None. IMPRESSION: No acute intracranial abnormality identified. Unremarkable CT of the head. Electronically Signed   By: Mitzi Hansen M.D.   On: 05/17/2017 00:44    Time Spent in minutes  30   Susa Raring M.D on 05/18/2017 at 9:35 AM  Between 7am to 7pm - Pager - [phone] ( page via [email or web address], text pages only, please mention full 10 digit call back number). After 7pm go to [email or web address] - password [facility]

## 2017-05-18 NOTE — Progress Notes (Signed)
INFECTIOUS DISEASE PROGRESS NOTE  ID: Ashley Bates is a 56 y.o. female with  Principal Problem:   SIRS (systemic inflammatory response syndrome) (HCC) Active Problems:   RA (rheumatoid arthritis) (HCC)  Subjective: No complaints  Abtx:  Anti-infectives    Start     Dose/Rate Route Frequency Ordered Stop   05/18/17 1200  valACYclovir (VALTREX) tablet 1,000 mg     1,000 mg Oral 2 times daily 05/18/17 1107     05/18/17 1200  doxycycline (VIBRA-TABS) tablet 100 mg     100 mg Oral Every 12 hours 05/18/17 1107     05/17/17 1800  doxycycline (VIBRAMYCIN) 100 mg in dextrose 5 % 250 mL IVPB  Status:  Discontinued     100 mg 125 mL/hr over 120 Minutes Intravenous Every 12 hours 05/17/17 0344 05/18/17 1107   05/17/17 1200  vancomycin (VANCOCIN) IVPB 1000 mg/200 mL premix  Status:  Discontinued     1,000 mg 200 mL/hr over 60 Minutes Intravenous Every 12 hours 05/17/17 0234 05/17/17 0906   05/17/17 1200  acyclovir (ZOVIRAX) 700 mg in dextrose 5 % 100 mL IVPB  Status:  Discontinued     10 mg/kg  70 kg 114 mL/hr over 60 Minutes Intravenous Every 8 hours 05/17/17 0401 05/18/17 1107   05/17/17 0800  piperacillin-tazobactam (ZOSYN) IVPB 3.375 g  Status:  Discontinued     3.375 g 12.5 mL/hr over 240 Minutes Intravenous Every 8 hours 05/17/17 0234 05/17/17 0344   05/17/17 0600  cefTRIAXone (ROCEPHIN) 2 g in dextrose 5 % 50 mL IVPB  Status:  Discontinued     2 g 100 mL/hr over 30 Minutes Intravenous Every 12 hours 05/17/17 0401 05/17/17 0906   05/17/17 0600  doxycycline (VIBRAMYCIN) 100 mg in dextrose 5 % 250 mL IVPB     100 mg 125 mL/hr over 120 Minutes Intravenous NOW 05/17/17 0553 05/17/17 0820   05/17/17 0400  ampicillin (OMNIPEN) 2 g in sodium chloride 0.9 % 50 mL IVPB  Status:  Discontinued     2 g 150 mL/hr over 20 Minutes Intravenous Every 4 hours 05/17/17 0344 05/17/17 0906   05/17/17 0215  acyclovir (ZOVIRAX) 700 mg in dextrose 5 % 100 mL IVPB     10 mg/kg  70 kg 114 mL/hr  over 60 Minutes Intravenous  Once 05/17/17 0205 05/17/17 1259   05/17/17 0030  piperacillin-tazobactam (ZOSYN) IVPB 3.375 g     3.375 g 100 mL/hr over 30 Minutes Intravenous  Once 05/17/17 0017 05/17/17 1258   05/17/17 0030  vancomycin (VANCOCIN) IVPB 1000 mg/200 mL premix     1,000 mg 200 mL/hr over 60 Minutes Intravenous  Once 05/17/17 0017 05/17/17 1259   05/17/17 0030  doxycycline (VIBRAMYCIN) 100 mg in dextrose 5 % 250 mL IVPB  Status:  Discontinued     100 mg 125 mL/hr over 120 Minutes Intravenous  Once 05/17/17 0017 05/17/17 0553      Medications:  Scheduled: . docusate sodium  200 mg Oral BID  . doxycycline  100 mg Oral Q12H  . [START ON 05/19/2017] hydrocortisone sod succinate (SOLU-CORTEF) inj  50 mg Intravenous Daily  . valACYclovir  1,000 mg Oral BID    Objective: Vital signs in last 24 hours: Temp:  [97.5 F (36.4 C)-98.4 F (36.9 C)] 98.2 F (36.8 C) (07/03 1438) Pulse Rate:  [65-112] 69 (07/03 1438) Resp:  [18-28] 18 (07/03 1438) BP: (99-128)/(52-90) 110/68 (07/03 1438) SpO2:  [94 %-100 %] 100 % (07/03 1438) Weight:  [  72.6 kg (160 lb)] 72.6 kg (160 lb) (07/03 1057)   General appearance: alert, cooperative and no distress Resp: clear to auscultation bilaterally Cardio: regular rate and rhythm GI: normal findings: bowel sounds normal and soft, non-tender  Lab Results  Recent Labs  05/16/17 2247 05/17/17 0524  WBC 11.6* 10.2  HGB 13.5 10.9*  HCT 39.7 32.6*  NA 135 139  K 3.6 3.8  CL 103 112*  CO2 23 20*  BUN 14 12  CREATININE 0.84 0.72   Liver Panel  Recent Labs  05/16/17 2305  PROT 8.4*  ALBUMIN 4.5  AST 37  ALT 39  ALKPHOS 64  BILITOT 1.0  BILIDIR 0.1  IBILI 0.9   Sedimentation Rate No results for input(s): ESRSEDRATE in the last 72 hours. C-Reactive Protein No results for input(s): CRP in the last 72 hours.  Microbiology: Recent Results (from the past 240 hour(s))  Blood Culture (routine x 2)     Status: None (Preliminary  result)   Collection Time: 05/17/17 12:17 AM  Result Value Ref Range Status   Specimen Description BLOOD LEFT ANTECUBITAL  Final   Special Requests   Final    BOTTLES DRAWN AEROBIC AND ANAEROBIC Blood Culture adequate volume   Culture   Final    NO GROWTH 1 DAY Performed at [facility], [address], [place], [place] [address]    Report Status PENDING  Incomplete  Urine culture     Status: None   Collection Time: 05/17/17 12:17 AM  Result Value Ref Range Status   Specimen Description URINE, RANDOM  Final   Special Requests NONE  Final   Culture   Final    NO GROWTH Performed at [facility], [address], [place], [place] [address]    Report Status 05/18/2017 FINAL  Final  CSF culture     Status: None (Preliminary result)   Collection Time: 05/17/17  2:13 AM  Result Value Ref Range Status   Specimen Description CSF  Final   Special Requests CSF  Final   Gram Stain   Final    NO WBC SEEN NO ORGANISMS SEEN Gram Stain Report Called to,Read Back By and Verified With: B.JESSEE,RN 0345 05/17/17 W.SHEA    Culture   Final    NO GROWTH 1 DAY Performed at [facility], [address], [place], [place] [address]    Report Status PENDING  Incomplete  Blood Culture (routine x 2)     Status: None (Preliminary result)   Collection Time: 05/17/17  2:20 AM  Result Value Ref Range Status   Specimen Description BLOOD RIGHT HAND  Final   Special Requests IN PEDIATRIC BOTTLE Blood Culture adequate volume  Final   Culture   Final    NO GROWTH < 24 HOURS Performed at [facility], [address] [place], [place] [address]    Report Status PENDING  Incomplete  MRSA PCR Screening     Status: None   Collection Time: 05/17/17 12:46 PM  Result Value Ref Range Status   MRSA by PCR NEGATIVE NEGATIVE Final    Comment:        The GeneXpert MRSA Assay (FDA approved for NASAL specimens only), is one component of a comprehensive MRSA colonization surveillance program.  It is not intended to diagnose MRSA infection nor to guide or monitor treatment for MRSA infections.     Studies/Results: Dg Chest 2 View  Result Date: 05/17/2017 CLINICAL DATA:  Weakness  and nausea. EXAM: CHEST  2 VIEW COMPARISON:  May 14, 2015 FINDINGS: The cardiomediastinal silhouette is stable. No pulmonary nodules or masses. No focal infiltrates. No acute abnormalities. IMPRESSION: No active cardiopulmonary disease. Electronically Signed   By: Gerome Sam III M.D   On: 05/17/2017 00:30   Ct Head Wo Contrast  Result Date: 05/17/2017 CLINICAL DATA:  56 y/o F; headache, dizziness, nausea, and weakness for 1 day. EXAM: CT HEAD WITHOUT CONTRAST TECHNIQUE: Contiguous axial images were obtained from the base of the skull through the vertex without intravenous contrast. COMPARISON:  None. FINDINGS: Brain: No evidence of acute infarction, hemorrhage, hydrocephalus, extra-axial collection or mass lesion/mass effect. Vascular: Mild calcific atherosclerosis of carotid siphons. Skull: Normal. Negative for fracture or focal lesion. Sinuses/Orbits: No acute finding. Other: None. IMPRESSION: No acute intracranial abnormality identified. Unremarkable CT of the head. Electronically Signed   By: Mitzi Hansen M.D.   On: 05/17/2017 00:44     Assessment/Plan: Fever Viral syndrome? RA ? Adrenal insufficiency  Total days of antibiotics: 2 doxy/acyclovir--> doxy/valtrex  Cx are all ngtd Enterovirus, HSV PCR on CSF pending Lyme/RMSF pending Will add ehrlichia  prob d/c soon         Johny Sax Infectious Diseases (pager) [phone] [email or web address] 05/18/2017, 4:59 PM  LOS: 1 day

## 2017-05-19 DIAGNOSIS — M0579 Rheumatoid arthritis with rheumatoid factor of multiple sites without organ or systems involvement: Secondary | ICD-10-CM

## 2017-05-19 DIAGNOSIS — E039 Hypothyroidism, unspecified: Secondary | ICD-10-CM

## 2017-05-19 LAB — ENTEROVIRUS PCR: Enterovirus PCR: NEGATIVE

## 2017-05-19 LAB — ROCKY MTN SPOTTED FVR ABS PNL(IGG+IGM)
RMSF IGG: NEGATIVE
RMSF IGM: 0.93 {index} — AB (ref 0.00–0.89)

## 2017-05-19 LAB — LYME DISEASE DNA BY PCR(BORRELIA BURG): Lyme Disease(B.burgdorferi)PCR: NEGATIVE

## 2017-05-19 LAB — PROCALCITONIN: PROCALCITONIN: 0.18 ng/mL

## 2017-05-19 MED ORDER — IBUPROFEN 400 MG PO TABS: 400.0000 mg | ORAL_TABLET | Freq: Four times a day (QID) | ORAL | 0 refills | Status: DC | PRN

## 2017-05-19 MED ORDER — CYCLOBENZAPRINE HCL 5 MG PO TABS: 5.0000 mg | ORAL_TABLET | Freq: Three times a day (TID) | ORAL | 0 refills | Status: DC | PRN

## 2017-05-19 MED ORDER — CYCLOBENZAPRINE HCL 5 MG PO TABS
5.0000 mg | ORAL_TABLET | Freq: Three times a day (TID) | ORAL | Status: DC
  Administered 2017-05-19: 5 mg via ORAL
  Filled 2017-05-19: qty 1

## 2017-05-19 MED ORDER — KETOROLAC TROMETHAMINE 15 MG/ML IJ SOLN
15.0000 mg | Freq: Once | INTRAMUSCULAR | Status: AC
  Administered 2017-05-19: 15 mg via INTRAVENOUS
  Filled 2017-05-19: qty 1

## 2017-05-19 MED ORDER — LEVOTHYROXINE SODIUM 25 MCG PO TABS: 25.0000 ug | ORAL_TABLET | Freq: Every day | ORAL | 0 refills | Status: DC

## 2017-05-19 MED ORDER — RANITIDINE HCL 150 MG PO TABS: 150.0000 mg | ORAL_TABLET | Freq: Every day | ORAL | 0 refills | Status: DC

## 2017-05-19 NOTE — Discharge Summary (Signed)
Physician Discharge Summary  Ashley Bates OIZ:[id number] DOB: 08-19-1961 DOA: 05/16/2017  PCP: Casey Burkitt, MD  Admit date: 05/16/2017 Discharge date: 05/19/2017  Admitted From: Home  Disposition:  Home   Recommendations for Outpatient Follow-up:  1. Follow up with PCP in 1- weeks 2. Patient placed on flexeril and ibuprofen for pain control.   Home Health: No  Equipment/Devices: Na   Discharge Condition: Stable  CODE STATUS: Full  Diet recommendation: Regular  Brief/Interim Summary: 56 year old female who presented with fevers, chills and headache. Patient is known to have her dry throat is on methotrexate, ongoing symptoms for last 24 hours prior to hospitalization. On the initial physical examination, patient was febrile, temperature 102, blood pressure 94/57, heart rate 86, respiratory rate 23, oxygen saturation 92%. Patient was awake and alert, positive neck rigidity, lungs were clear to auscultation bilaterally, heart S1-S2 present rhythmic, abdomen was soft, no lower extremity edema. Sodium 135, potassium 3.6, chloride 103, bicarbonate 23, glucose 113, BUN 14, creatinine 0.84, AST 37, ALT 59, white count 11.6, hemoglobin 13.5, hematocrit 39.7, platelets 185. Urinalysis negative for infection Patient admitted with a possible diagnosis of meningitis. Cerebrospinal fluid was not consistent with bacterial infection, viral serologies pending. EKG normal sinus rhythm. CT head with no acute intracranial abnormalities, chest x-ray with no infiltrates.   The patient was admitted to hospital with working diagnosis of systemic inflammatory response syndrome, rule out meningitis.  1. Systemic inflammatory response syndrome. Patient was admitted to medical unit, she was placed on broad spectrum antibiotics and a lumbar puncture was performed. The cerebrospinal fluid had 0 white cells, clear, 4 RBCs, glucose 58. Second tube, blood contamination with 237 RBCs and 17 white cells, Gram  stain with no organisms. Cultures remain no growth. Viral serologies were sent, still pending. Life threatening infection has been ruled out, patient will be discharged home with a follow-up with primary care in 7 days. [facility] spotted fever serology 0.93, inconclusive, low pretest probability due to normal white cell count normal platelets and normal LFTs, no evidence of tick bite. Symptoms improved with ibuprofen and Flexeril.  2. Rheumatoid arthritis. Patient will resume methotrexate.  3. Hypotension. Pressure improved with IV fluids. Her plasma cortisol was 14, within normal range for an cortisol. Considering patient's clinical scenario, no life-threatening infection or frank sepsis will hold on steroids and will recommend a close follow-up as outpatient. If further concern for adrenal insufficiency a cosyntropin stim test should be performed.    4. Hypothyroidism. Elevated TSH, patient apparently not taking levothyroxine, she has been instructed to resume this medication, and a refill was prescribed.   Discharge Diagnoses:  Principal Problem:   SIRS (systemic inflammatory response syndrome) (HCC) Active Problems:   RA (rheumatoid arthritis) (HCC)   Fever    Discharge Instructions   Allergies as of 05/19/2017   No Known Allergies     Medication List    STOP taking these medications   diclofenac sodium 1 % Gel Commonly known as:  VOLTAREN   predniSONE 5 MG tablet Commonly known as:  DELTASONE     TAKE these medications   cyclobenzaprine 5 MG tablet Commonly known as:  FLEXERIL Take 1 tablet (5 mg total) by mouth 3 (three) times daily as needed for muscle spasms.   folic acid 1 MG tablet Commonly known as:  FOLVITE Take 2 tablets (2 mg total) by mouth daily.   ibuprofen 400 MG tablet Commonly known as:  ADVIL,MOTRIN Take 1 tablet (400 mg total) by mouth  every 6 (six) hours as needed for headache.   levothyroxine 25 MCG tablet Commonly known as:  SYNTHROID,  LEVOTHROID Take 1 tablet (25 mcg total) by mouth daily before breakfast. khudh 1 qurs yawmiaan 30 daqiqat qabl al'iiftar.   methotrexate 2.5 MG tablet Commonly known as:  RHEUMATREX Take 8 tablets (20 mg total) by mouth once a week. Caution:Chemotherapy. Protect from light.   polyethylene glycol powder powder Commonly known as:  GLYCOLAX/MIRALAX Take 17 g by mouth 2 (two) times daily as needed.   ranitidine 150 MG tablet Commonly known as:  ZANTAC Take 1 tablet (150 mg total) by mouth at bedtime.       No Known Allergies  Consultations:  Infectious disease    Procedures/Studies: Dg Chest 2 View  Result Date: 05/17/2017 CLINICAL DATA:  Weakness and nausea. EXAM: CHEST  2 VIEW COMPARISON:  May 14, 2015 FINDINGS: The cardiomediastinal silhouette is stable. No pulmonary nodules or masses. No focal infiltrates. No acute abnormalities. IMPRESSION: No active cardiopulmonary disease. Electronically Signed   By: Gerome Sam III M.D   On: 05/17/2017 00:30   Ct Head Wo Contrast  Result Date: 05/17/2017 CLINICAL DATA:  56 y/o F; headache, dizziness, nausea, and weakness for 1 day. EXAM: CT HEAD WITHOUT CONTRAST TECHNIQUE: Contiguous axial images were obtained from the base of the skull through the vertex without intravenous contrast. COMPARISON:  None. FINDINGS: Brain: No evidence of acute infarction, hemorrhage, hydrocephalus, extra-axial collection or mass lesion/mass effect. Vascular: Mild calcific atherosclerosis of carotid siphons. Skull: Normal. Negative for fracture or focal lesion. Sinuses/Orbits: No acute finding. Other: None. IMPRESSION: No acute intracranial abnormality identified. Unremarkable CT of the head. Electronically Signed   By: Mitzi Hansen M.D.   On: 05/17/2017 00:44      Subjective: Patient feeling better, no nausea or vomiting, improved neck pain and headache. No dyspnea or chest pain.   Discharge Exam: Vitals:   05/19/17 0501 05/19/17 1352  BP:  117/80 116/82  Pulse: 67 69  Resp: 18 16  Temp: 98 F (36.7 C) 98.3 F (36.8 C)   Vitals:   05/18/17 1438 05/18/17 2031 05/19/17 0501 05/19/17 1352  BP: 110/68 (!) 104/58 117/80 116/82  Pulse: 69 75 67 69  Resp: 18 16 18 16   Temp: 98.2 F (36.8 C) 98.6 F (37 C) 98 F (36.7 C) 98.3 F (36.8 C)  TempSrc: Oral Oral Oral Oral  SpO2: 100% 98% 99% 100%  Weight:      Height:        General: Pt is alert, awake, not in acute distress E ENT: no pallor or icterus, oral mucosa moist.  Cardiovascular: RRR, S1/S2 +, no rubs, no gallops Respiratory: CTA bilaterally, no wheezing, no rhonchi Abdominal: Soft, NT, ND, bowel sounds + Extremities: no edema, no cyanosis    The results of significant diagnostics from this hospitalization (including imaging, microbiology, ancillary and laboratory) are listed below for reference.     Microbiology: Recent Results (from the past 240 hour(s))  Blood Culture (routine x 2)     Status: None (Preliminary result)   Collection Time: 05/17/17 12:17 AM  Result Value Ref Range Status   Specimen Description BLOOD LEFT ANTECUBITAL  Final   Special Requests   Final    BOTTLES DRAWN AEROBIC AND ANAEROBIC Blood Culture adequate volume   Culture   Final    NO GROWTH 2 DAYS Performed at [facility], [address], [place], [place]    Report Status PENDING  Incomplete  Urine culture     Status: None   Collection Time: 05/17/17 12:17 AM  Result Value Ref Range Status   Specimen Description URINE, RANDOM  Final   Special Requests NONE  Final   Culture   Final    NO GROWTH Performed at [facility], [address] [place], [place] [address]    Report Status 05/18/2017 FINAL  Final  CSF culture     Status: None (Preliminary result)   Collection Time: 05/17/17  2:13 AM  Result Value Ref Range Status   Specimen Description CSF  Final   Special Requests CSF  Final   Gram Stain   Final    NO WBC SEEN NO ORGANISMS SEEN Gram  Stain Report Called to,Read Back By and Verified With: B.JESSEE,RN 0345 05/17/17 W.SHEA    Culture   Final    NO GROWTH 2 DAYS Performed at [facility], [address] [place], [place] [address]    Report Status PENDING  Incomplete  Blood Culture (routine x 2)     Status: None (Preliminary result)   Collection Time: 05/17/17  2:20 AM  Result Value Ref Range Status   Specimen Description BLOOD RIGHT HAND  Final   Special Requests IN PEDIATRIC BOTTLE Blood Culture adequate volume  Final   Culture   Final    NO GROWTH 2 DAYS Performed at [facility], [address] [place], [place] [address]    Report Status PENDING  Incomplete  MRSA PCR Screening     Status: None   Collection Time: 05/17/17 12:46 PM  Result Value Ref Range Status   MRSA by PCR NEGATIVE NEGATIVE Final    Comment:        The GeneXpert MRSA Assay (FDA approved for NASAL specimens only), is one component of a comprehensive MRSA colonization surveillance program. It is not intended to diagnose MRSA infection nor to guide or monitor treatment for MRSA infections.      Labs: BNP (last 3 results) No results for input(s): BNP in the last 8760 hours. Basic Metabolic Panel:  Recent Labs Lab 05/16/17 2247 05/17/17 0524  NA 135 139  K 3.6 3.8  CL 103 112*  CO2 23 20*  GLUCOSE 113* 109*  BUN 14 12  CREATININE 0.84 0.72  CALCIUM 9.1 7.4*   Liver Function Tests:  Recent Labs Lab 05/16/17 2305  AST 37  ALT 39  ALKPHOS 64  BILITOT 1.0  PROT 8.4*  ALBUMIN 4.5   No results for input(s): LIPASE, AMYLASE in the last 168 hours. No results for input(s): AMMONIA in the last 168 hours. CBC:  Recent Labs Lab 05/16/17 2247 05/17/17 0524  WBC 11.6* 10.2  NEUTROABS 9.6*  --   HGB 13.5 10.9*  HCT 39.7 32.6*  MCV 89.6 90.3  PLT 185 157   Cardiac Enzymes: No results for input(s): CKTOTAL, CKMB, CKMBINDEX, TROPONINI in the last 168 hours. BNP: Invalid input(s): POCBNP CBG: No results for  input(s): GLUCAP in the last 168 hours. D-Dimer No results for input(s): DDIMER in the last 72 hours. Hgb A1c No results for input(s): HGBA1C in the last 72 hours. Lipid Profile No results for input(s): CHOL, HDL, LDLCALC, TRIG, CHOLHDL, LDLDIRECT in the last 72 hours. Thyroid function studies No results for input(s): TSH, T4TOTAL, T3FREE, THYROIDAB in the last 72 hours.  Invalid input(s): FREET3 Anemia work up No results for input(s): VITAMINB12, FOLATE, FERRITIN, TIBC, IRON, RETICCTPCT in the last 72 hours.  Urinalysis    Component Value Date/Time   COLORURINE YELLOW 05/16/2017 2247   APPEARANCEUR CLEAR 05/16/2017 2247   LABSPEC 1.012 05/16/2017 2247   PHURINE 8.0 05/16/2017 2247   GLUCOSEU NEGATIVE 05/16/2017 2247   HGBUR NEGATIVE 05/16/2017 2247   BILIRUBINUR NEGATIVE 05/16/2017 2247   KETONESUR NEGATIVE 05/16/2017 2247   PROTEINUR NEGATIVE 05/16/2017 2247   UROBILINOGEN 0.2 02/20/2016 1947   NITRITE NEGATIVE 05/16/2017 2247   LEUKOCYTESUR TRACE (A) 05/16/2017 2247   Sepsis Labs Invalid input(s): PROCALCITONIN,  WBC,  LACTICIDVEN Microbiology Recent Results (from the past 240 hour(s))  Blood Culture (routine x 2)     Status: None (Preliminary result)   Collection Time: 05/17/17 12:17 AM  Result Value Ref Range Status   Specimen Description BLOOD LEFT ANTECUBITAL  Final   Special Requests   Final    BOTTLES DRAWN AEROBIC AND ANAEROBIC Blood Culture adequate volume   Culture   Final    NO GROWTH 2 DAYS Performed at [facility], [address], [place], [place] [address]    Report Status PENDING  Incomplete  Urine culture     Status: None   Collection Time: 05/17/17 12:17 AM  Result Value Ref Range Status   Specimen Description URINE, RANDOM  Final   Special Requests NONE  Final   Culture   Final    NO GROWTH Performed at [facility], [address] [place], [place] [address]    Report Status 05/18/2017 FINAL  Final  CSF culture     Status: None  (Preliminary result)   Collection Time: 05/17/17  2:13 AM  Result Value Ref Range Status   Specimen Description CSF  Final   Special Requests CSF  Final   Gram Stain   Final    NO WBC SEEN NO ORGANISMS SEEN Gram Stain Report Called to,Read Back By and Verified With: B.JESSEE,RN 0345 05/17/17 W.SHEA    Culture   Final    NO GROWTH 2 DAYS Performed at [facility], [address], [place], [place] [address]    Report Status PENDING  Incomplete  Blood Culture (routine x 2)     Status: None (Preliminary result)   Collection Time: 05/17/17  2:20 AM  Result Value Ref Range Status   Specimen Description BLOOD RIGHT HAND  Final   Special Requests IN PEDIATRIC BOTTLE Blood Culture adequate volume  Final   Culture   Final    NO GROWTH 2 DAYS Performed at [facility], [address], [place], [place] [address]    Report Status PENDING  Incomplete  MRSA PCR Screening     Status: None   Collection Time: 05/17/17 12:46 PM  Result Value Ref Range Status   MRSA by PCR NEGATIVE NEGATIVE Final    Comment:        The GeneXpert MRSA Assay (FDA approved for NASAL specimens only), is one component of a comprehensive MRSA colonization surveillance program. It is not intended to diagnose MRSA infection nor to guide or monitor treatment for MRSA infections.      Time coordinating discharge: 45 minutes  SIGNED:   Coralie Keens, MD  [facility] 05/19/2017, 2:04 PM Pager   If 7PM-7AM, please contact night-coverage [email or web address] Password [email or web address]

## 2017-05-19 NOTE — Progress Notes (Signed)
Pt discharged home with grandson in stable condition. Discharged from unit via wheelchair. Discharge instructions given. Pt and family verbalized understanding. No immediate questions or concerns at this time.

## 2017-05-20 LAB — CSF CULTURE
CULTURE: NO GROWTH
GRAM STAIN: NONE SEEN

## 2017-05-20 LAB — CSF CULTURE W GRAM STAIN

## 2017-05-21 LAB — HERPES SIMPLEX VIRUS(HSV) DNA BY PCR
HSV 1 DNA: NEGATIVE
HSV 2 DNA: NEGATIVE

## 2017-05-21 LAB — EHRLICHIA ANTIBODY PANEL
E CHAFFEENSIS AB, IGG: NEGATIVE
E CHAFFEENSIS AB, IGM: NEGATIVE
E. CHAFFEENSIS IGG AB: NEGATIVE
E. Chaffeensis (HME) IgM Titer: NEGATIVE

## 2017-05-22 LAB — CULTURE, BLOOD (ROUTINE X 2)
Culture: NO GROWTH
Culture: NO GROWTH
Special Requests: ADEQUATE
Special Requests: ADEQUATE

## 2017-05-31 ENCOUNTER — Ambulatory Visit (INDEPENDENT_AMBULATORY_CARE_PROVIDER_SITE_OTHER): Payer: Medicaid Other | Admitting: Internal Medicine

## 2017-05-31 ENCOUNTER — Encounter: Payer: Self-pay | Admitting: Internal Medicine

## 2017-05-31 VITALS — BP 108/68 | HR 83 | Temp 99.0°F | Wt 159.2 lb

## 2017-05-31 DIAGNOSIS — Z09 Encounter for follow-up examination after completed treatment for conditions other than malignant neoplasm: Secondary | ICD-10-CM | POA: Diagnosis present

## 2017-05-31 MED ORDER — CYCLOBENZAPRINE HCL 5 MG PO TABS: 5.0000 mg | ORAL_TABLET | Freq: Three times a day (TID) | ORAL | 2 refills | Status: DC | PRN

## 2017-05-31 MED ORDER — RANITIDINE HCL 150 MG PO TABS: 150.0000 mg | ORAL_TABLET | Freq: Every day | ORAL | 2 refills | Status: DC

## 2017-05-31 MED ORDER — LEVOTHYROXINE SODIUM 25 MCG PO TABS: 25.0000 ug | ORAL_TABLET | Freq: Every day | ORAL | 1 refills | Status: DC

## 2017-05-31 MED ORDER — IBUPROFEN 400 MG PO TABS: 400.0000 mg | ORAL_TABLET | Freq: Four times a day (QID) | ORAL | 2 refills | Status: DC | PRN

## 2017-05-31 NOTE — Progress Notes (Signed)
[facility] Progress Note  Subjective:  Ashley Bates is a 56 y.o. female with history of RA, chronic cervical and lumbar back pain, hypothyroidism s/p ablation, and chronic headaches who presents for hospital follow-up. Visit assisted by [facility] Arabic phone interpreter [place] [phone]).   #Hospital follow-up: - Patient admitted from 7/1-7/4 for work-up of SIRS. Because of neck stiffness, fever, immunocompromised status (on methotrexate for RA), slight leukocytosis and presentation with headaches, her workup included lumbar puncture, which had negative culture and viral serologies. Negative head CT, normal chest x-ray. Inconclusive [place] spotted fever serology, though low suspicion. Continued to have lower BPs but had normal am cortisol. Home prednisone stopped (though was not an active medication -- rx from taper earlier in the year). Told upon discharge she likely had a virus. - Patient reports feeling much better since leaving the hospital. She brought her medications and shows new prescriptions for levothyroxine 25 mcg, flexeril 5 mg, ibuprofen 400 mg prn, and ranitidine 150 mg QHS. She also showed bottles for methotrexate and folic acid. - Patient says she thinks her energy level has improved with levothyroxine. She had not taken as previously prescribed because is not using any of the listed pharmacies. Preferred pharmacy is [facility].  - Flexeril helps sensation of tightness in her shoulders and makes it easier to fall asleep at night. - Taking 1-2 ibuprofen tablets for headache about every few days.  - Denies fevers but complains of chronic hotflashes that cause her to sweat, especially on her face. ROS: No n/v/d, no increased neck stiffness  No Known Allergies   Social: Former smoker  Objective: Blood pressure 108/68, pulse 83, temperature 99 F (37.2 C), temperature source Oral, weight 159 lb 3.2 oz (72.2 kg), SpO2 99  %. Constitutional: Well-appearing female, in NAD, wearing hijab HENT: MMM Cardiovascular: RRR, S1, S2, no m/r/g.  Pulmonary/Chest: Effort normal and breath sounds normal. No respiratory distress.  Musculoskeletal: No TTP over midline spine. Normal ROM of neck.  Neurological: AOx3, no focal deficits. Skin: Skin is warm and dry. Few small excoriated bug bites on L lower leg.  Psychiatric: Normal mood and affect.  Vitals reviewed  Assessment/Plan: Hospital discharge follow-up - Patient appears well. Vitals signs normal. - Placed refills for levothyroxine, flexeril, ibuprofen, and ranitidine. Counseled patient not to use ibuprofen more than half the days of the month due to risk of rebound headache.  - Counseled on importance of taking levothyroxine regularly. - Suggested trying black cohosh for menopausal hot flashes (normal LFTs during admission)  Follow-up next month for TSH level and pap smear.  Dani Gobble, MD [facility], PGY-3

## 2017-05-31 NOTE — Patient Instructions (Signed)
        levothyroxine 25     .        .         .      ()   . alsayidat bikar , yrja muasalat levothyroxine 25 mikrughram liainkhifad hurmun alghudat aldarqiati. 'awad 'iieadat fuhs mustawayatik fi nihayat shahr 'aghusts. yumkinuna aydana 'iijra' fahs einq alrahim fi dhalik alwaqt. yumkinuk muhawalat kawhush alsuwda' (mlahaq) li'aerad falash alsaakhinat.  Ashley Bates,  Please continue levothyroxine 25 mcg for low thyroid horome. I would like to recheck your levels towards the end of August. We could also do your pap smear at that time.  You could try black cohosh (a supplement) for hot flash symptoms.

## 2017-06-01 DIAGNOSIS — Z09 Encounter for follow-up examination after completed treatment for conditions other than malignant neoplasm: Secondary | ICD-10-CM | POA: Insufficient documentation

## 2017-06-01 NOTE — Assessment & Plan Note (Signed)
-   Patient appears well. Vitals signs normal. - Placed refills for levothyroxine, flexeril, ibuprofen, and ranitidine. Counseled patient not to use ibuprofen more than half the days of the month due to risk of rebound headache.  - Counseled on importance of taking levothyroxine regularly. - Suggested trying black cohosh for menopausal hot flashes (normal LFTs during admission)

## 2017-06-29 ENCOUNTER — Other Ambulatory Visit (HOSPITAL_COMMUNITY)
Admission: RE | Admit: 2017-06-29 | Discharge: 2017-06-29 | Disposition: A | Discharge status: Home / Self Care | Payer: Medicaid Other | Source: Ambulatory Visit | Attending: Family Medicine | Admitting: Family Medicine

## 2017-06-29 ENCOUNTER — Ambulatory Visit (INDEPENDENT_AMBULATORY_CARE_PROVIDER_SITE_OTHER): Payer: Medicaid Other | Admitting: Internal Medicine

## 2017-06-29 ENCOUNTER — Encounter: Payer: Self-pay | Admitting: Internal Medicine

## 2017-06-29 VITALS — BP 104/76 | HR 89 | Temp 98.1°F | Ht 63.0 in | Wt 159.0 lb

## 2017-06-29 DIAGNOSIS — N898 Other specified noninflammatory disorders of vagina: Secondary | ICD-10-CM

## 2017-06-29 DIAGNOSIS — Z124 Encounter for screening for malignant neoplasm of cervix: Secondary | ICD-10-CM | POA: Diagnosis not present

## 2017-06-29 DIAGNOSIS — E039 Hypothyroidism, unspecified: Secondary | ICD-10-CM | POA: Diagnosis present

## 2017-06-29 LAB — POCT WET PREP (WET MOUNT)
Clue Cells Wet Prep Whiff POC: NEGATIVE
TRICHOMONAS WET PREP HPF POC: ABSENT

## 2017-06-29 MED ORDER — LEVOTHYROXINE SODIUM 25 MCG PO TABS: 25.0000 ug | ORAL_TABLET | Freq: Every day | ORAL | 1 refills | Status: DC

## 2017-06-29 MED ORDER — CYCLOBENZAPRINE HCL 5 MG PO TABS: 5.0000 mg | ORAL_TABLET | Freq: Three times a day (TID) | ORAL | 2 refills | Status: DC | PRN

## 2017-06-29 MED ORDER — RANITIDINE HCL 150 MG PO TABS: 150.0000 mg | ORAL_TABLET | Freq: Every day | ORAL | 2 refills | Status: DC

## 2017-06-29 MED ORDER — IBUPROFEN 400 MG PO TABS: 400.0000 mg | ORAL_TABLET | Freq: Four times a day (QID) | ORAL | 2 refills | Status: DC | PRN

## 2017-06-29 MED ORDER — WOMENS MULTIVITAMIN PO TABS: 1.0000 | ORAL_TABLET | Freq: Every day | ORAL | 11 refills | Status: DC

## 2017-06-29 MED ORDER — FOLIC ACID 1 MG PO TABS: 2.0000 mg | ORAL_TABLET | Freq: Every day | ORAL | 11 refills | Status: DC

## 2017-06-29 NOTE — Patient Instructions (Signed)
[name],  The supplement I recommended for hot flashes was called black cohosh.  For sleep, try 5 mg of melatonin an hour before bed.  Continue synthroid 25 mcg daily. If your labs are still low, I may need to increase the dose.  I will send you a letter with your pap smear results.  Best, Dr. Sampson Goon                .     5       .   synthroid 25  .            .          Ashley Bates bikar , kan [name] 'awsayat bih fi alhubat alssakhinat yusamaa kawhush alswda'. lilnawm , hawal 5 malagh min almaylatunin qabl saeat min alnuwm. tuasil synthroid 25 makgh yawmianaan. 'iidha kanat mukhtabaratuk la tazal munkhafidatan , faqad 'ahtaj 'iilaa ziadat aljareat. sa'ursil lak risalatan tahtawi ealaa natayij musahat einq alruhm. al'afdal, duktur fytzjyrald

## 2017-06-30 ENCOUNTER — Other Ambulatory Visit: Payer: Self-pay | Admitting: Internal Medicine

## 2017-06-30 ENCOUNTER — Encounter: Payer: Self-pay | Admitting: Internal Medicine

## 2017-06-30 DIAGNOSIS — Z124 Encounter for screening for malignant neoplasm of cervix: Secondary | ICD-10-CM | POA: Insufficient documentation

## 2017-06-30 DIAGNOSIS — E039 Hypothyroidism, unspecified: Secondary | ICD-10-CM | POA: Insufficient documentation

## 2017-06-30 LAB — CERVICOVAGINAL ANCILLARY ONLY
CHLAMYDIA, DNA PROBE: NEGATIVE
NEISSERIA GONORRHEA: NEGATIVE

## 2017-06-30 LAB — CYTOLOGY - PAP
DIAGNOSIS: NEGATIVE
HPV: NOT DETECTED

## 2017-06-30 LAB — TSH: TSH: 4.74 u[IU]/mL — ABNORMAL HIGH (ref 0.450–4.500)

## 2017-06-30 MED ORDER — FLUCONAZOLE 150 MG PO TABS: 150.0000 mg | ORAL_TABLET | Freq: Once | ORAL | 0 refills | Status: AC

## 2017-06-30 MED ORDER — LEVOTHYROXINE SODIUM 50 MCG PO TABS: 50.0000 ug | ORAL_TABLET | Freq: Every day | ORAL | 1 refills | Status: DC

## 2017-06-30 NOTE — Assessment & Plan Note (Signed)
-   Performed pap smear with wet prep and gc/chlaymdia given complaint of malodorous discharge - Wet prep with yeast; could not reach patient by phone; sent letter and ordered diflucan for patient to take if still having symptoms

## 2017-06-30 NOTE — Assessment & Plan Note (Signed)
-   Will obtain TSH today, as may need to increase levothyroxine to 50 mcg from 25 mcg

## 2017-06-30 NOTE — Progress Notes (Signed)
[facility] Progress Note  Subjective:  Ashley Bates is a 56 y.o. female with history of DDD, postsurgical hypothyroidism, and rheumatoid arthritis who presents to have pap smear performed and for medication refills. Visit assisted by Arabic interpreter [name] [phone]).   #Pap smear: - Says had one when first entered [place] (2015); thinks was normal - Notices odor and increased discharge at times - Not sexually active ROS: No dysuria, no fevers  #Hypothyroidism: - Has been taking levothyroxine regularly; just ran out today - still feeling tired with poor appetite  Social History Narrative   Immigrant Social History:   - Date arrived in [place]: 01/18/14   - Country of origin: [place]   - Location of refugee camp (if applicable), how long there, and what caused patient to leave home country?: No camp, was able to flee country straight to [place]   - Primary language: Arabic   -Requires intepreter (essentially speaks no [place])   - Tobacco/alcohol/drug use: denies   - Marriage Status: Married. With 1 son   Former smoker.   No Known Allergies  Objective: Blood pressure 104/76, pulse 89, temperature 98.1 F (36.7 C), temperature source Oral, height 5\' 3"  (1.6 m), weight 159 lb (72.1 kg). Constitutional: Well-appearing female, in NAD, wearing hijab Abdominal: Soft. +BS, NT, ND GU: No lesions noted on speculum exam. Small amount of thicker white discharge. No cervical motion tenderness on bimanual exam. Chaperone present.  Skin: Skin is warm and dry. No rash noted.  Psychiatric: Somewhat anxious affect.  Vitals reviewed  Assessment/Plan: Cervical cancer screening - Performed pap smear with wet prep and gc/chlaymdia given complaint of malodorous discharge - Wet prep with yeast; could not reach patient by phone; sent letter and ordered diflucan for patient to take if still having symptoms  Hypothyroidism - Will obtain TSH today, as may need to increase  levothyroxine to 50 mcg from 25 mcg  Provided refills on folic acid, ranitidine, ibuprofen, synthroid.   Follow-up in 2 months for hypothyroidism.  , MD [facility], PGY-3

## 2017-08-06 ENCOUNTER — Other Ambulatory Visit: Payer: Self-pay

## 2017-08-06 DIAGNOSIS — Z79899 Other long term (current) drug therapy: Secondary | ICD-10-CM

## 2017-08-06 LAB — COMPLETE METABOLIC PANEL WITH GFR
AG Ratio: 1.3 (calc) (ref 1.0–2.5)
ALT: 15 U/L (ref 6–29)
AST: 20 U/L (ref 10–35)
Albumin: 4.3 g/dL (ref 3.6–5.1)
Alkaline phosphatase (APISO): 57 U/L (ref 33–130)
BUN: 11 mg/dL (ref 7–25)
CALCIUM: 9.2 mg/dL (ref 8.6–10.4)
CO2: 27 mmol/L (ref 20–32)
Chloride: 104 mmol/L (ref 98–110)
Creat: 0.74 mg/dL (ref 0.50–1.05)
GFR, EST AFRICAN AMERICAN: 104 mL/min/{1.73_m2} (ref >=60)
GFR, EST NON AFRICAN AMERICAN: 90 mL/min/{1.73_m2} (ref >=60)
GLUCOSE: 88 mg/dL (ref 65–99)
Globulin: 3.2 g/dL (calc) (ref 1.9–3.7)
POTASSIUM: 4.6 mmol/L (ref 3.5–5.3)
Sodium: 140 mmol/L (ref 135–146)
Total Bilirubin: 0.5 mg/dL (ref 0.2–1.2)
Total Protein: 7.5 g/dL (ref 6.1–8.1)

## 2017-08-06 LAB — CBC WITH DIFFERENTIAL/PLATELET
BASOS ABS: 58 {cells}/uL (ref 0–200)
Basophils Relative: 0.8 %
EOS ABS: 110 {cells}/uL (ref 15–500)
Eosinophils Relative: 1.5 %
HCT: 36.6 % (ref 35.0–45.0)
Hemoglobin: 12 g/dL (ref 11.7–15.5)
Lymphs Abs: 2132 cells/uL (ref 850–3900)
MCH: 29.3 pg (ref 27.0–33.0)
MCHC: 32.8 g/dL (ref 32.0–36.0)
MCV: 89.5 fL (ref 80.0–100.0)
MONOS PCT: 6.6 %
MPV: 12 fL (ref 7.5–12.5)
NEUTROS PCT: 61.9 %
Neutro Abs: 4519 cells/uL (ref 1500–7800)
Platelets: 210 10*3/uL (ref 140–400)
RBC: 4.09 10*6/uL (ref 3.80–5.10)
RDW: 13.2 % (ref 11.0–15.0)
TOTAL LYMPHOCYTE: 29.2 %
WBC: 7.3 10*3/uL (ref 3.8–10.8)
WBCMIX: 482 {cells}/uL (ref 200–950)

## 2017-08-07 NOTE — Progress Notes (Signed)
wnl

## 2017-08-13 ENCOUNTER — Other Ambulatory Visit: Payer: Self-pay | Admitting: Rheumatology

## 2017-08-16 NOTE — Telephone Encounter (Signed)
Last Visit: 10/07/16 Next Visit past due and message sent to front to schedule patient. Labs: 08/06/17 WNL  Okay to refill MTX?

## 2017-08-16 NOTE — Telephone Encounter (Signed)
ok 

## 2017-08-17 ENCOUNTER — Telehealth: Payer: Self-pay | Admitting: Rheumatology

## 2017-08-17 NOTE — Telephone Encounter (Signed)
Patient needs a refill on MTX for 3 months. Patient got refill for one month this last time. Patient wants to use [facility] [address]

## 2017-08-17 NOTE — Telephone Encounter (Signed)
Attempted to contact [name] and she had stepped out of the office. Will try to reach out again.

## 2017-08-18 NOTE — Telephone Encounter (Signed)
Spoke with Ashley Bates and advised a 90 day prescription was sent to the pharmacy on 08/16/17. Advised her the prescription was sent to the pharmacy that requested the prescription. Ashley Bates will contact [facility] on [address] to have the prescription transferred.

## 2017-08-25 ENCOUNTER — Telehealth: Payer: Self-pay | Admitting: Rheumatology

## 2017-08-25 NOTE — Telephone Encounter (Signed)
She should contact her PCP

## 2017-08-25 NOTE — Telephone Encounter (Signed)
Spoke with Ashley Bates and advised that Ms. Teagarden will need to speak with the PCP regarding this letter.

## 2017-08-25 NOTE — Telephone Encounter (Signed)
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 She also needs it to state that she comes in for appointments at least every 6 months and also comes in for labs. This letter is needed to help her keep power at her house since DSS helps with her power bill. Her power will be cut off on 08/27/17 without this note. Can you please call [name] if any further questions or when the note is ready if we are able to write it.

## 2017-08-26 ENCOUNTER — Telehealth: Payer: Self-pay | Admitting: Internal Medicine

## 2017-08-26 NOTE — Telephone Encounter (Signed)
Patient needs a letter for [employer] & [employer] stating that she is a patient here and what her medical conditions are so that she can keep her heat/electricity on because of her (bursitis?).  Needs before 16th of October please.  Please call [name] at  [phone] with any questions.

## 2017-08-27 ENCOUNTER — Encounter: Payer: Self-pay | Admitting: Internal Medicine

## 2017-08-27 NOTE — Telephone Encounter (Signed)
Wrote letter saying lack of electricity especially in colder months could worsen patient's RA and DDD. Routed letter to [employer].

## 2017-08-27 NOTE — Telephone Encounter (Signed)
LM for [name] that letter is ready for pick up. Pamelyn Bancroft,CMA

## 2017-09-11 NOTE — Progress Notes (Signed)
Office Visit Note  Patient: Ashley Bates             Date of Birth: [birth year]           MRN: [id number]             PCP: Casey Burkitt, MD Referring: Erlinda Hong* Visit Date: 09/22/2017 Occupation: @GUAROCC @   Interpreter:   Subjective:  Arthritis (BIL hand, shoulder joint and BIL knee pain )   History of Present Illness: Ashley Bates is a 56 y.o. female with history of sero positive rheumatoid arthritis. She states that she's been having pain and discomfort in her bilateral hands, shoulders and bilateral knee joints. She states this difficult for her to leave the house sometimes due to discomfort in her hands. She denies any joint swelling.   Activities of Daily Living:  Patient reports morning stiffness for 1 hour.   Patient Denies nocturnal pain.  Difficulty dressing/grooming: Denies Difficulty climbing stairs: Reports Difficulty getting out of chair: Denies Difficulty using hands for taps, buttons, cutlery, and/or writing: Reports   Review of Systems  Constitutional: Positive for fatigue. Negative for night sweats, weight gain, weight loss and weakness.  HENT: Negative for mouth sores, trouble swallowing, trouble swallowing, mouth dryness and nose dryness.   Eyes: Negative for pain, redness, visual disturbance and dryness.  Respiratory: Negative for cough, shortness of breath and difficulty breathing.   Cardiovascular: Negative for chest pain, palpitations, hypertension, irregular heartbeat and swelling in legs/feet.  Gastrointestinal: Negative for blood in stool, constipation and diarrhea.  Endocrine: Negative for increased urination.  Genitourinary: Negative for vaginal dryness.  Musculoskeletal: Positive for arthralgias, joint pain, myalgias, morning stiffness and myalgias. Negative for joint swelling, muscle weakness and muscle tenderness.  Skin: Negative for color change, rash, hair loss, skin tightness, ulcers and  sensitivity to sunlight.  Allergic/Immunologic: Negative for susceptible to infections.  Neurological: Negative for dizziness, memory loss and night sweats.  Hematological: Negative for swollen glands.  Psychiatric/Behavioral: Negative for depressed mood and sleep disturbance. The patient is not nervous/anxious.     PMFS History:  Patient Active Problem List   Diagnosis Date Noted  . Cervical cancer screening 06/30/2017  . Hypothyroidism 06/30/2017  . Hospital discharge follow-up 06/01/2017  . Fever   . SIRS (systemic inflammatory response syndrome) (HCC) 05/17/2017  . Healthcare maintenance 04/08/2017  . High risk medication use 10/30/2016  . DDD lumbar spine 10/30/2016  . Neuropathy 10/30/2016  . ANA positive 10/28/2016  . DJD (degenerative joint disease), cervical 10/06/2016  . DDD (degenerative disc disease), lumbar 10/06/2016  . 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 Vitamin D deficiency 10/06/2016  . Pelvic pain in female 02/29/2016  . Elevated TSH 07/16/2015  . Spinal stenosis of lumbar region 03/21/2015  . Left foot drop 03/07/2015  . Lumbago 03/07/2015  . Refugee health examination 03/07/2015  . Glaucoma 03/07/2015  . RA (rheumatoid arthritis) (HCC) 03/04/2015    Past Medical History:  Diagnosis Date  . Arthritis   . Back pain   . DDD (degenerative disc disease), cervical 10/06/2016  . DDD (degenerative disc disease), lumbar 10/06/2016  . 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 Post-surgical hypothyroidism   . Scoliosis   . Spinal stenosis   . Vitamin D deficiency 10/06/2016    Family History  Problem Relation Age of Onset  . Diabetes Neg Hx   . Hyperlipidemia Neg Hx   . Hypertension Neg Hx   . Heart failure Neg Hx  Past Surgical History:  Procedure Laterality Date  . BACK SURGERY     x 3 (last in 2009)  . CARPAL TUNNEL RELEASE     2007  . CATARACT EXTRACTION, BILATERAL Bilateral   . LUMBAR DISC SURGERY    . THYROIDECTOMY     2006   Social  History   Social History Narrative   Immigrant Social History:   - Date arrived in [place]: 01/18/14   - Country of origin: [place]   - Location of refugee camp (if applicable), how long there, and what caused patient to leave home country?: No camp, was able to flee country straight to [place]   - Primary language: Arabic   -Requires intepreter (essentially speaks no [place])   - Tobacco/alcohol/drug use: denies   - Marriage Status: Married. With 1 son     Objective: Vital Signs: BP 138/82 (BP Location: Left Arm, Patient Position: Sitting, Cuff Size: Normal)   Pulse 78   Resp 18   Ht 5' 4.96" (1.65 m)   Wt 163 lb (73.9 kg)   BMI 27.16 kg/m    Physical Exam  Constitutional: She is oriented to person, place, and time. She appears well-developed and well-nourished.  HENT:  Head: Normocephalic and atraumatic.  Eyes: Conjunctivae and EOM are normal.  Neck: Normal range of motion.  Cardiovascular: Normal rate, regular rhythm, normal heart sounds and intact distal pulses.  Pulmonary/Chest: Effort normal and breath sounds normal.  Abdominal: Soft. Bowel sounds are normal.  Lymphadenopathy:    She has no cervical adenopathy.  Neurological: She is alert and oriented to person, place, and time.  Skin: Skin is warm and dry. Capillary refill takes less than 2 seconds.  Psychiatric: She has a normal mood and affect. Her behavior is normal.  Nursing note and vitals reviewed.    Musculoskeletal Exam: C-spine and thoracic spine lumbar spine some discomfort with range of motion. She has some discomfort range of motion of right shoulder. Elbow joints wrist joints MCPs with good range of motion with no synovitis. She is some DIP PIP thickening in her hands consistent with osteoarthritis. Hip joints knee joints ankles MTPs PIPs with good range of motion.  CDAI Exam: CDAI Homunculus Exam:   Tenderness:  Left hand: 4th MCP  Joint Counts:  CDAI Tender Joint count: 1 CDAI Swollen Joint  count: 0  Global Assessments:  Patient Global Assessment: 5 Provider Global Assessment: 2  CDAI Calculated Score: 8    Investigation: No additional findings. CBC Latest Ref Rng & Units 08/06/2017 05/17/2017 05/16/2017  WBC 3.8 - 10.8 Thousand/uL 7.3 10.2 11.6(H)  Hemoglobin 11.7 - 15.5 g/dL 96.7 10.9(L) 13.5  Hematocrit 35.0 - 45.0 % 36.6 32.6(L) 39.7  Platelets 140 - 400 Thousand/uL 210 157 185   CMP Latest Ref Rng & Units 08/06/2017 05/17/2017 05/16/2017  Glucose 65 - 99 mg/dL 88 893(Y) 101(B)  BUN 7 - 25 mg/dL 11 12 14   Creatinine 0.50 - 1.05 mg/dL 5.10 2.58 5.27  Sodium 135 - 146 mmol/L 140 139 135  Potassium 3.5 - 5.3 mmol/L 4.6 3.8 3.6  Chloride 98 - 110 mmol/L 104 112(H) 103  CO2 20 - 32 mmol/L 27 20(L) 23  Calcium 8.6 - 10.4 mg/dL 9.2 7.4(L) 9.1  Total Protein 6.1 - 8.1 g/dL 7.5 - 8.4(H)  Total Bilirubin 0.2 - 1.2 mg/dL 0.5 - 1.0  Alkaline Phos 38 - 126 U/L - - 64  AST 10 - 35 U/L 20 - 37  ALT 6 - 29 U/L  15 - 39    Imaging: No results found.  Speciality Comments: No specialty comments available.    Procedures:  No procedures performed Allergies: Patient has no known allergies.   Assessment / Plan:     Visit Diagnoses: Rheumatoid arthritis involving multiple sites with positive rheumatoid factor (HCC) - +RF, +CCP, +ANA, +S, +nodules. Patient continues to have pain and discomfort in multiple joints. She does not have any synovitis on examination. I believe most of her discomfort is coming from underlying osteoarthritis.   High risk medication use - MTX 8 tabs po q wk , folic acid 2 mg po qd. Her labs have been stable. We'll continue to monitor her labs every 3 months.  Chronic pain of right knee: She's been having increased knee joint discomfort and some patellar instability. I'll give her a prescription for  brace for the right knee joint.  Primary osteoarthritis of both hands: She does have some ongoing discomfort in her hands due to underlying osteoarthritis. Joint  protection and muscle strengthening discussed.  DDD (degenerative disc disease), cervical: Chronic pain  DDD (degenerative disc disease), lumbar - s/p discectomy: Chronic pain  Other medical problems are listed as follows:  Left foot drop  Peroneal neuropathy, unspecified laterality  History of glaucoma  History of vitamin D deficiency  History of thyroidectomy  History of hypothyroidism    Orders: No orders of the defined types were placed in this encounter.  No orders of the defined types were placed in this encounter.   Face-to-face time spent with patient was 30 minutes. Greater than50% of time was spent in counseling and coordination of care.  Follow-Up Instructions: Return in about 5 months (around 02/20/2018) for Rheumatoid arthritis.   Pollyann Savoy, MD  Note - This record has been created using Animal nutritionist.  Chart creation errors have been sought, but may not always  have been located. Such creation errors do not reflect on  the standard of medical care.

## 2017-09-22 ENCOUNTER — Ambulatory Visit: Payer: Medicaid Other | Admitting: Rheumatology

## 2017-09-22 ENCOUNTER — Encounter: Payer: Self-pay | Admitting: Rheumatology

## 2017-09-22 VITALS — BP 138/82 | HR 78 | Resp 18 | Ht 64.96 in | Wt 163.0 lb

## 2017-09-22 DIAGNOSIS — M0579 Rheumatoid arthritis with rheumatoid factor of multiple sites without organ or systems involvement: Secondary | ICD-10-CM

## 2017-09-22 DIAGNOSIS — M21372 Foot drop, left foot: Secondary | ICD-10-CM

## 2017-09-22 DIAGNOSIS — Z8669 Personal history of other diseases of the nervous system and sense organs: Secondary | ICD-10-CM | POA: Diagnosis not present

## 2017-09-22 DIAGNOSIS — M25561 Pain in right knee: Secondary | ICD-10-CM | POA: Diagnosis not present

## 2017-09-22 DIAGNOSIS — G5730 Lesion of lateral popliteal nerve, unspecified lower limb: Secondary | ICD-10-CM | POA: Diagnosis not present

## 2017-09-22 DIAGNOSIS — G8929 Other chronic pain: Secondary | ICD-10-CM

## 2017-09-22 DIAGNOSIS — Z9009 Acquired absence of other part of head and neck: Secondary | ICD-10-CM

## 2017-09-22 DIAGNOSIS — M5030 Other cervical disc degeneration, unspecified cervical region: Secondary | ICD-10-CM | POA: Diagnosis not present

## 2017-09-22 DIAGNOSIS — E890 Postprocedural hypothyroidism: Secondary | ICD-10-CM

## 2017-09-22 DIAGNOSIS — M19041 Primary osteoarthritis, right hand: Secondary | ICD-10-CM | POA: Diagnosis not present

## 2017-09-22 DIAGNOSIS — Z79899 Other long term (current) drug therapy: Secondary | ICD-10-CM

## 2017-09-22 DIAGNOSIS — M5136 Other intervertebral disc degeneration, lumbar region: Secondary | ICD-10-CM

## 2017-09-22 DIAGNOSIS — M19042 Primary osteoarthritis, left hand: Secondary | ICD-10-CM

## 2017-09-22 DIAGNOSIS — Z9889 Other specified postprocedural states: Secondary | ICD-10-CM | POA: Diagnosis not present

## 2017-09-22 DIAGNOSIS — Z9089 Acquired absence of other organs: Secondary | ICD-10-CM

## 2017-09-22 DIAGNOSIS — Z8639 Personal history of other endocrine, nutritional and metabolic disease: Secondary | ICD-10-CM

## 2017-09-22 DIAGNOSIS — M51369 Other intervertebral disc degeneration, lumbar region without mention of lumbar back pain or lower extremity pain: Secondary | ICD-10-CM

## 2017-09-22 NOTE — Patient Instructions (Signed)
Standing Labs We placed an order today for your standing lab work.    Please come back and get your standing labs in December and every 3 months  We have open lab Monday through Friday from 8:30-11:30 AM and 1:30-4 PM at the office of Dr. Bethannie Iglehart.   The office is located at [address], [address], [place], [place] [address] No appointment is necessary.   Labs are drawn by [facility].  You may receive a bill from [facility] for your lab work. If you have any questions regarding directions or hours of operation,  please call [phone].    

## 2017-09-23 ENCOUNTER — Encounter: Payer: Self-pay | Admitting: Licensed Clinical Social Worker

## 2017-09-23 ENCOUNTER — Encounter: Payer: Self-pay | Admitting: Student

## 2017-09-23 ENCOUNTER — Other Ambulatory Visit: Payer: Self-pay

## 2017-09-23 ENCOUNTER — Other Ambulatory Visit: Payer: Self-pay | Admitting: Internal Medicine

## 2017-09-23 ENCOUNTER — Ambulatory Visit: Payer: Medicaid Other | Admitting: Student

## 2017-09-23 VITALS — BP 104/72 | HR 75 | Temp 98.5°F | Ht 65.0 in | Wt 162.0 lb

## 2017-09-23 DIAGNOSIS — F329 Major depressive disorder, single episode, unspecified: Secondary | ICD-10-CM

## 2017-09-23 DIAGNOSIS — F32A Depression, unspecified: Secondary | ICD-10-CM

## 2017-09-23 DIAGNOSIS — G4700 Insomnia, unspecified: Secondary | ICD-10-CM

## 2017-09-23 DIAGNOSIS — F39 Unspecified mood [affective] disorder: Secondary | ICD-10-CM

## 2017-09-23 DIAGNOSIS — Z23 Encounter for immunization: Secondary | ICD-10-CM

## 2017-09-23 MED ORDER — TRAZODONE HCL 50 MG PO TABS: 50.0000 mg | ORAL_TABLET | Freq: Every evening | ORAL | 0 refills | Status: DC | PRN

## 2017-09-23 NOTE — Progress Notes (Signed)
ESTIMATE TIME:30 minutes Type of Service: Integrated Behavioral Health warm handoff  Interpreter:Yes.   (Arabic)  Basma [phone]   SUBJECTIVE: Ashley Bates is a 56 y.o. female referred by Dr. Alanda Slim for: symptoms of  depression and stressors related to: family issues.  Patient was accompanied by son. Patient reports the following concerns: "feels like I have a heavy sensation"  irritability, loss of interest in favorite activities, sexual difficulty and sleep disturbance,  not laughing or wanting to smile.  Reports she did not have these symptoms prior to moving to the [place]. Duration of problem: states has gotten worse over past 5 to 6 months Impact: does not want to be intimate  with husband, does not want to go out. Becomes very agitated when talking to son.   Current / Hx of substance use: denies Risk of harm to self or others: No plan to harm self or others  LIFE CONTEXT:  Family & Social:patient lives with husband of 35 years and son, moved to [place] three years ago. School / Work /Fun: receives SSI; unable to find pleasure in anything  Life changes: spends most of her thinking about her children in [place], patient states this is her main concern.  GOALS: Patient will reduce symptoms of: mood instability, and increase  ability OY:DXAJOI skills and self-management skills. INTERVENTION: , Reflective listening, Behavioral Therapy (Relaxed breathing),    ISSUES DISCUSSED: Integrated care services, support system, previous and current coping skills, things patient enjoy or use to enjoy doing, and demonstration of relaxed breathing. ASSESSMENT:Patient currently experiencing symptoms of depression which maybe directly related to living in the [place] without her 6 other childen. Patient may benefit from, and is in agreement to receive further assessment therapeutic interventions to assist with managing her symptoms.  PLAN:   1.Patient will F/U with the mood clinic as recommended by Dr. Alanda Slim  2.  Behavioral recommendations: relaxed breathing  Warm Hand Off Completed.     Sammuel Hines, LCSW Licensed Clinical Social Worker [facility]   [phone] 8:57 AM

## 2017-09-23 NOTE — Progress Notes (Signed)
Subjective:    Ashley Bates is a 56 y.o. old female here to discuss mood issue Pacific interpreter was ID number [id number] was used for this encounter  HPI  Mood issue: She reports irritability and yelling at people. "I hate everything. I hate laughing, work..." She says she always worry about her children in [place]. She is worried something bad may happen to them. She also reports fatigue, poor concentration and difficulty sleeping. These symptoms have been going on for three years. She says she is here today because her symptoms are not getting better. Denies personal history of mental illness. However, son with depression and anxiety. Denies SI/HI. She is asking if she could see one of the doctor her son sees for his mood issue. She also like to know if there is a medication she could try. She denies smoking or recreational drug use.  She is on chemotherapy for heumatoid arthritis. She also has history of hypothyroidism.   PMH/Problem List: has RA (rheumatoid arthritis) (HCC); Left foot drop; Lumbago; Refugee health examination; Glaucoma; Spinal stenosis of lumbar region; Elevated TSH; Pelvic pain in female; DJD (degenerative joint disease), cervical; DDD (degenerative disc disease), lumbar; Peroneal neuropathy; Vitamin D deficiency; ANA positive; High risk medication use; DDD lumbar spine; Neuropathy; Healthcare maintenance; SIRS (systemic inflammatory response syndrome) (HCC); Fever; Hospital discharge follow-up; Cervical cancer screening; and Hypothyroidism on their problem list.   has a past medical history of Arthritis, Back pain, DDD (degenerative disc disease), cervical (10/06/2016), DDD (degenerative disc disease), lumbar (10/06/2016), Peroneal neuropathy (10/06/2016), Post-surgical hypothyroidism, Scoliosis, Spinal stenosis, and Vitamin D deficiency (10/06/2016).  FH:  Family History  Problem Relation Age of Onset  . Diabetes Neg Hx   . Hyperlipidemia Neg Hx   . Hypertension Neg Hx   . Heart  failure Neg Hx     SH Social History   Tobacco Use  . Smoking status: Former Games developer  . Smokeless tobacco: Never Used  Substance Use Topics  . Alcohol use: No  . Drug use: No    Review of Systems Review of systems negative except for pertinent positives and negatives in history of present illness above.     Objective:     Vitals:   09/23/17 1447  BP: 104/72  Pulse: 75  Temp: 98.5 F (36.9 C)  TempSrc: Oral  SpO2: 98%  Weight: 162 lb (73.5 kg)  Height: 5\' 5"  (1.651 m)   Body mass index is 26.96 kg/m.  Physical Exam GENERAL: appears well, no ditress HEENT: PERRLA, MMM LUNGS:  No IWOB HEART:  RRR with no M/R/G EXT:  No C/E/E NEURO:  A&O x 3 PSYCH: normal affect    Assessment and Plan:  1. Mood disorder [facility]): she obviously meets criteria for depression. However, her concern about her children's safety is appropriate, and has significant contribution to her symptoms. There could also be some effect from her chemotherapy for her RA. She also have history of hypothyroidism but her last TSH was within normal range. Will check vit D, B12 and TSH.  I would avoid SSRI for now until she is evaluated fully in mood clinic given her irritability and anger control issue at this time. Will try Trazodone 50 mg which could her help with sleep. She is interested in [facility]. Warm hand off started. Gave her phone number to mood clinic for follow up.   2. Need for immunization against influenza - Flu Vaccine QUAD 36+ mos IM   [facility], MD 09/23/17 Pager: [phone]

## 2017-09-23 NOTE — Patient Instructions (Signed)
It was great seeing you today! We have addressed the following issues today  Mood issue: I suggest you follow-up at our mood clinic to discuss about this in detail.  Meanwhile, we gave you a prescription to help you with sleep.  This could also help with your mood as well  If we did any lab work today, and the results require attention, either me or my nurse will get in touch with you. If everything is normal, you will get a letter in mail and a message via . If you don't hear from [place] in two weeks, please give [place] a call. Otherwise, we look forward to seeing you again at your next visit. If you have any questions or concerns before then, please call the clinic at [phone].  Please bring all your medications to every doctors visit  Sign up for My Chart to have easy access to your labs results, and communication with your Primary care physician.    Please check-out at the front desk before leaving the clinic.    Take Care,   Dr. Alanda Slim

## 2017-09-24 LAB — TSH: TSH: 3.9 u[IU]/mL (ref 0.450–4.500)

## 2017-09-24 LAB — VITAMIN D 25 HYDROXY (VIT D DEFICIENCY, FRACTURES): Vit D, 25-Hydroxy: 23.2 ng/mL — ABNORMAL LOW (ref 30.0–100.0)

## 2017-09-24 LAB — VITAMIN B12: Vitamin B-12: 373 pg/mL (ref 232–1245)

## 2017-10-11 ENCOUNTER — Telehealth (INDEPENDENT_AMBULATORY_CARE_PROVIDER_SITE_OTHER): Payer: Self-pay | Admitting: Rheumatology

## 2017-10-11 NOTE — Telephone Encounter (Signed)
09/22/2017 OV NOTE FAXED [employer] [phone]

## 2017-11-13 ENCOUNTER — Other Ambulatory Visit: Payer: Self-pay | Admitting: Student

## 2017-11-13 DIAGNOSIS — F39 Unspecified mood [affective] disorder: Secondary | ICD-10-CM

## 2017-11-26 ENCOUNTER — Other Ambulatory Visit: Payer: Self-pay | Admitting: *Deleted

## 2017-11-26 ENCOUNTER — Other Ambulatory Visit: Payer: Self-pay

## 2017-11-26 DIAGNOSIS — Z79899 Other long term (current) drug therapy: Secondary | ICD-10-CM

## 2017-11-26 MED ORDER — PREDNISONE 5 MG PO TABS: ORAL_TABLET | ORAL | 0 refills | Status: DC

## 2017-11-26 NOTE — Progress Notes (Signed)
Patient came to office for labs. patient was having increased swelling. Per Dr. Corliss Skains patient has been scheduled for an appointment to discuss treatment options. Per Dr. Corliss Skains prednisone taper to be sent to the pharmacy.

## 2017-11-27 LAB — CBC WITH DIFFERENTIAL/PLATELET
BASOS ABS: 62 {cells}/uL (ref 0–200)
Basophils Relative: 0.9 %
EOS PCT: 1.3 %
Eosinophils Absolute: 90 cells/uL (ref 15–500)
HCT: 37.1 % (ref 35.0–45.0)
HEMOGLOBIN: 12.5 g/dL (ref 11.7–15.5)
LYMPHS ABS: 2263 {cells}/uL (ref 850–3900)
MCH: 28.7 pg (ref 27.0–33.0)
MCHC: 33.7 g/dL (ref 32.0–36.0)
MCV: 85.3 fL (ref 80.0–100.0)
MPV: 11.9 fL (ref 7.5–12.5)
Monocytes Relative: 6.7 %
NEUTROS ABS: 4023 {cells}/uL (ref 1500–7800)
Neutrophils Relative %: 58.3 %
Platelets: 232 10*3/uL (ref 140–400)
RBC: 4.35 10*6/uL (ref 3.80–5.10)
RDW: 13.2 % (ref 11.0–15.0)
Total Lymphocyte: 32.8 %
WBC: 6.9 10*3/uL (ref 3.8–10.8)
WBCMIX: 462 {cells}/uL (ref 200–950)

## 2017-11-27 LAB — COMPLETE METABOLIC PANEL WITH GFR
AG RATIO: 1.4 (calc) (ref 1.0–2.5)
ALT: 16 U/L (ref 6–29)
AST: 20 U/L (ref 10–35)
Albumin: 4.3 g/dL (ref 3.6–5.1)
Alkaline phosphatase (APISO): 68 U/L (ref 33–130)
BILIRUBIN TOTAL: 0.6 mg/dL (ref 0.2–1.2)
BUN: 10 mg/dL (ref 7–25)
CALCIUM: 9.3 mg/dL (ref 8.6–10.4)
CHLORIDE: 104 mmol/L (ref 98–110)
CO2: 29 mmol/L (ref 20–32)
Creat: 0.72 mg/dL (ref 0.50–1.05)
GFR, Est African American: 108 mL/min/{1.73_m2} (ref >=60)
GFR, Est Non African American: 93 mL/min/{1.73_m2} (ref >=60)
GLOBULIN: 3.1 g/dL (ref 1.9–3.7)
Glucose, Bld: 91 mg/dL (ref 65–99)
POTASSIUM: 4.8 mmol/L (ref 3.5–5.3)
SODIUM: 140 mmol/L (ref 135–146)
TOTAL PROTEIN: 7.4 g/dL (ref 6.1–8.1)

## 2017-11-29 NOTE — Progress Notes (Signed)
wnl

## 2017-11-29 NOTE — Progress Notes (Signed)
Office Visit Note  Patient: Ashley Bates             Date of Birth: 03-09-1961           MRN: [id number]             PCP: Casey Burkitt, MD Referring: Erlinda Hong* Visit Date: 12/02/2017 Occupation: @GUAROCC @    Subjective:  Bilateral hand pain    History of Present Illness: Ashley Bates is a 57 y.o. female  With history of seropositive rheumatoid arthritis, osteoarthritis, and DDD.  She is having pain in multiple joints including bilateral hand pain, knee pain, feet pain hip pain.  She states she continues to take MTX 8 tablets weekly and folic acid 2 mg daily.  She is having increase joint stiffness and joint swelling.  She has not picked up her prescription for prednisone yet.    Activities of Daily Living:  Patient reports morning stiffness for 30 minutes.   Patient Reports nocturnal pain.  Difficulty dressing/grooming: Denies Difficulty climbing stairs: Denies Difficulty getting out of chair: Reports Difficulty using hands for taps, buttons, cutlery, and/or writing: Reports   Review of Systems  Constitutional: Positive for fatigue. Negative for weakness.  HENT: Negative for mouth sores, mouth dryness and nose dryness.   Eyes: Positive for dryness. Negative for redness.  Respiratory: Negative for cough and shortness of breath.   Cardiovascular: Positive for palpitations. Negative for chest pain, hypertension and swelling in legs/feet.  Gastrointestinal: Positive for constipation. Negative for blood in stool and diarrhea.  Genitourinary: Negative for painful urination.  Musculoskeletal: Positive for arthralgias, joint pain, joint swelling and morning stiffness. Negative for myalgias, muscle weakness, muscle tenderness and myalgias.  Skin: Negative for color change, pallor, rash, hair loss, nodules/bumps, redness, skin tightness, ulcers and sensitivity to sunlight.  Neurological: Positive for headaches. Negative for dizziness and numbness.   Hematological: Negative for swollen glands.  Psychiatric/Behavioral: Positive for depressed mood and sleep disturbance. The patient is not nervous/anxious.     PMFS History:  Patient Active Problem List   Diagnosis Date Noted  . Cervical cancer screening 06/30/2017  . Hypothyroidism 06/30/2017  . Hospital discharge follow-up 06/01/2017  . Fever   . SIRS (systemic inflammatory response syndrome) (HCC) 05/17/2017  . Healthcare maintenance 04/08/2017  . High risk medication use 10/30/2016  . DDD lumbar spine 10/30/2016  . Neuropathy 10/30/2016  . ANA positive 10/28/2016  . DJD (degenerative joint disease), cervical 10/06/2016  . DDD (degenerative disc disease), lumbar 10/06/2016  . 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 Vitamin D deficiency 10/06/2016  . Pelvic pain in female 02/29/2016  . Elevated TSH 07/16/2015  . Spinal stenosis of lumbar region 03/21/2015  . Left foot drop 03/07/2015  . Lumbago 03/07/2015  . Refugee health examination 03/07/2015  . Glaucoma 03/07/2015  . RA (rheumatoid arthritis) (HCC) 03/04/2015    Past Medical History:  Diagnosis Date  . Arthritis   . Back pain   . DDD (degenerative disc disease), cervical 10/06/2016  . DDD (degenerative disc disease), lumbar 10/06/2016  . 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 Post-surgical hypothyroidism   . Scoliosis   . Spinal stenosis   . Vitamin D deficiency 10/06/2016    Family History  Problem Relation Age of Onset  . Diabetes Neg Hx   . Hyperlipidemia Neg Hx   . Hypertension Neg Hx   . Heart failure Neg Hx    Past Surgical History:  Procedure Laterality Date  . BACK SURGERY  x 3 (last in 2009)  . CARPAL TUNNEL RELEASE     2007  . CATARACT EXTRACTION, BILATERAL Bilateral   . LUMBAR DISC SURGERY    . THYROIDECTOMY     2006   Social History   Social History Narrative   Immigrant Social History:   - Date arrived in [place]: 01/18/14   - Country of origin: [place]   - Location of refugee camp (if  applicable), how long there, and what caused patient to leave home country?: No camp, was able to flee country straight to [place]   - Primary language: Arabic   -Requires intepreter (essentially speaks no [place])   - Tobacco/alcohol/drug use: denies   - Marriage Status: Married. With 1 son     Objective: Vital Signs: BP 132/88 (BP Location: Left Arm, Patient Position: Sitting, Cuff Size: Normal)   Pulse 67   Resp 16   Ht 5' 4.96" (1.65 m)   Wt 163 lb (73.9 kg)   BMI 27.16 kg/m    Physical Exam  Constitutional: She is oriented to person, place, and time. She appears well-developed and well-nourished.  HENT:  Head: Normocephalic and atraumatic.  Eyes: Conjunctivae and EOM are normal.  Neck: Normal range of motion.  Cardiovascular: Normal rate, regular rhythm, normal heart sounds and intact distal pulses.  Pulmonary/Chest: Effort normal and breath sounds normal.  Abdominal: Soft. Bowel sounds are normal.  Lymphadenopathy:    She has no cervical adenopathy.  Neurological: She is alert and oriented to person, place, and time.  Skin: Skin is warm and dry. Capillary refill takes less than 2 seconds.  Psychiatric: She has a normal mood and affect. Her behavior is normal.  Nursing note and vitals reviewed.    Musculoskeletal Exam: C-spine limited ROM.  No midline spinal tenderness.  No SI joint tenderness.  Shoulder joints good ROM.  Wrist ROM limited.  Fist formation 90% bilaterally.  Synovitis of 2nd, 3rd, and 4th MCPs bilaterally.  Hip joints, knee joints, ankle joints, MTPs, PIPs, and DIPs good ROM with no synovitis.  She has tenderness of MTP joints.  No warmth or effusion of knees.  Left foot drop. No trochanteric bursitis.     CDAI Exam: CDAI Homunculus Exam:   Tenderness:  Right hand: 2nd MCP, 3rd MCP and 4th MCP Left hand: 2nd MCP, 3rd MCP and 4th MCP RLE: tibiofemoral LLE: tibiofemoral  Joint Counts:  CDAI Tender Joint count: 8 CDAI Swollen Joint count:  0  Global Assessments:  Patient Global Assessment: 8   CDAI Calculated Score: 16    Investigation: No additional findings. CBC Latest Ref Rng & Units 11/26/2017 08/06/2017 05/17/2017  WBC 3.8 - 10.8 Thousand/uL 6.9 7.3 10.2  Hemoglobin 11.7 - 15.5 g/dL 97.0 26.3 10.9(L)  Hematocrit 35.0 - 45.0 % 37.1 36.6 32.6(L)  Platelets 140 - 400 Thousand/uL 232 210 157   CMP Latest Ref Rng & Units 11/26/2017 08/06/2017 05/17/2017  Glucose 65 - 99 mg/dL 91 88 785(Y)  BUN 7 - 25 mg/dL 10 11 12   Creatinine 0.50 - 1.05 mg/dL 8.50 2.77 4.12  Sodium 135 - 146 mmol/L 140 140 139  Potassium 3.5 - 5.3 mmol/L 4.8 4.6 3.8  Chloride 98 - 110 mmol/L 104 104 112(H)  CO2 20 - 32 mmol/L 29 27 20(L)  Calcium 8.6 - 10.4 mg/dL 9.3 9.2 7.4(L)  Total Protein 6.1 - 8.1 g/dL 7.4 7.5 -  Total Bilirubin 0.2 - 1.2 mg/dL 0.6 0.5 -  Alkaline Phos 38 - 126 U/L - - -  AST 10 - 35 U/L 20 20 -  ALT 6 - 29 U/L 16 15 -    Imaging: No results found.  Speciality Comments: No specialty comments available.    Procedures:  No procedures performed Allergies: Patient has no known allergies.     Assessment / Plan:     Visit Diagnoses: Rheumatoid arthritis involving multiple sites with positive rheumatoid factor (HCC) -  +RF, +CCP, +ANA, +S, +nodules.  She has synovitis of multiple MCP joints. She has pain in multiple joints. She is taking MTX 8 tablets weekly and folic acid 2 mg daily. She has not picked up her prescription of Prednisone that was sent in 6 days ago.  She was advised to pick up the Prednisone taper.  She will also be started on Humira.    Medication counseling:   TB Test: 05/14/15 negative  Hepatitis panel: 05/14/15 negative  HIV: 05/14/15 negative  SPEP: 05/14/15 negative  Immunoglobulins: IgA 456 on 05/14/15  Chest x-ray: no active cardiopulmonary disease 05/17/17  Does patient have diagnosis of heart failure?  No  Counseled patient that Humira is a TNF blocking agent.  Reviewed Humira dose of 40 mg every  other week.  Counseled patient on purpose, proper use, and adverse effects of Humira.  Reviewed the most common adverse effects including infections, headache, and injection site reactions. Discussed that there is the possibility of an increased risk of malignancy but it is not well understood if this increased risk is due to the medication or the disease state.  Advised patient to get yearly dermatology exams due to risk of skin cancer.  Reviewed the importance of regular labs while on Humira therapy.  Advised patient to get standing labs one month after starting Humira then every 3 months.  Provided patient with standing lab orders.  Counseled patient that Humira should be held prior to scheduled surgery.  Counseled patient to avoid live vaccines while on Humira.  Advised patient to get annual influenza vaccine and the pneumococcal vaccine as needed.  Provided patient with medication education material and answered all questions.  Patient voiced understanding.  Patient consented to Humira.  Will upload consent into the media tab.  Reviewed storage instructions of Humira.  Advised initial injection must be administered in office.    Patient voiced understanding.    High risk medication use - MTX 8 tabs po q wk , folic acid 2 mg po qd.  Primary osteoarthritis of both hands: She has PIP and DIP synovial thickening.    DDD (degenerative disc disease), cervical: Chronic pain  DDD (degenerative disc disease), lumbar - s/p discectomy: Chronic pain   Carpal tunnel syndrome on both sides: She has previous carpal tunnel release of her right wrist.  She is having symptoms of carpal tunnel again.  Positive Phalen's and Tinel's sign.  She was given a prescription for bilateral carpal tunnel braces that she can wear at night.   Left foot drop  Other medical conditions are listed as follows:   History of hypothyroidism  History of thyroidectomy  History of glaucoma  History of vitamin D  deficiency  Peroneal neuropathy, unspecified laterality    Orders: No orders of the defined types were placed in this encounter.  No orders of the defined types were placed in this encounter.   Face-to-face time spent with patient was 45 minutes. Greater than 50% of time was spent in counseling and coordination of care.  Follow-Up Instructions: Return in about 2 months (around 01/30/2018) for Rheumatoid  arthritis, Osteoarthritis.  Pollyann Savoy, MD  Note - This record has been created using Animal nutritionist.  Chart creation errors have been sought, but may not always  have been located. Such creation errors do not reflect on  the standard of medical care.

## 2017-12-02 ENCOUNTER — Telehealth: Payer: Self-pay

## 2017-12-02 ENCOUNTER — Ambulatory Visit (INDEPENDENT_AMBULATORY_CARE_PROVIDER_SITE_OTHER): Payer: Medicaid Other | Admitting: Rheumatology

## 2017-12-02 ENCOUNTER — Encounter: Payer: Self-pay | Admitting: Rheumatology

## 2017-12-02 VITALS — BP 132/88 | HR 67 | Resp 16 | Ht 64.96 in | Wt 163.0 lb

## 2017-12-02 DIAGNOSIS — M0579 Rheumatoid arthritis with rheumatoid factor of multiple sites without organ or systems involvement: Secondary | ICD-10-CM

## 2017-12-02 DIAGNOSIS — Z79899 Other long term (current) drug therapy: Secondary | ICD-10-CM | POA: Diagnosis not present

## 2017-12-02 DIAGNOSIS — Z8639 Personal history of other endocrine, nutritional and metabolic disease: Secondary | ICD-10-CM | POA: Diagnosis not present

## 2017-12-02 DIAGNOSIS — M19041 Primary osteoarthritis, right hand: Secondary | ICD-10-CM

## 2017-12-02 DIAGNOSIS — M21372 Foot drop, left foot: Secondary | ICD-10-CM

## 2017-12-02 DIAGNOSIS — M5136 Other intervertebral disc degeneration, lumbar region: Secondary | ICD-10-CM | POA: Diagnosis not present

## 2017-12-02 DIAGNOSIS — M19042 Primary osteoarthritis, left hand: Secondary | ICD-10-CM

## 2017-12-02 DIAGNOSIS — M51369 Other intervertebral disc degeneration, lumbar region without mention of lumbar back pain or lower extremity pain: Secondary | ICD-10-CM

## 2017-12-02 DIAGNOSIS — Z9089 Acquired absence of other organs: Secondary | ICD-10-CM

## 2017-12-02 DIAGNOSIS — E890 Postprocedural hypothyroidism: Secondary | ICD-10-CM

## 2017-12-02 DIAGNOSIS — Z9889 Other specified postprocedural states: Secondary | ICD-10-CM | POA: Diagnosis not present

## 2017-12-02 DIAGNOSIS — M5030 Other cervical disc degeneration, unspecified cervical region: Secondary | ICD-10-CM | POA: Diagnosis not present

## 2017-12-02 DIAGNOSIS — Z9009 Acquired absence of other part of head and neck: Secondary | ICD-10-CM

## 2017-12-02 DIAGNOSIS — G5603 Carpal tunnel syndrome, bilateral upper limbs: Secondary | ICD-10-CM | POA: Diagnosis not present

## 2017-12-02 DIAGNOSIS — Z8669 Personal history of other diseases of the nervous system and sense organs: Secondary | ICD-10-CM

## 2017-12-02 DIAGNOSIS — G5730 Lesion of lateral popliteal nerve, unspecified lower limb: Secondary | ICD-10-CM

## 2017-12-02 NOTE — Telephone Encounter (Signed)
Was asked to complete a PA for Humira. Called [employer] to initiate a PA. Spoke with Coralie Common who was able to process the auth over the phone. We should have a response within 24 hours. Medicaid will contact us if authorization is not approved. Will call back tomorrow to check status  Will update once we hear a response.  Ashley Bates, [place], CPhT 3:38 PM

## 2017-12-02 NOTE — Patient Instructions (Signed)
Adalimumab Injection What is this medicine? ADALIMUMAB (a dal AYE mu mab) is used to treat rheumatoid and psoriatic arthritis. It is also used to treat ankylosing spondylitis, Crohn's disease, ulcerative colitis, plaque psoriasis, hidradenitis suppurativa, and uveitis. This medicine may be used for other purposes; ask your health care provider or pharmacist if you have questions. COMMON BRAND NAME(S): CYLTEZO, Humira What should I tell my health care provider before I take this medicine? They need to know if you have any of these conditions: -diabetes -heart disease -hepatitis B or history of hepatitis B infection -immune system problems -infection or history of infections -multiple sclerosis -recently received or scheduled to receive a vaccine -scheduled to have surgery -tuberculosis, a positive skin test for tuberculosis or have recently been in close contact with someone who has tuberculosis -an unusual reaction to adalimumab, other medicines, mannitol, latex, rubber, foods, dyes, or preservatives -pregnant or trying to get pregnant -breast-feeding How should I use this medicine? This medicine is for injection under the skin. You will be taught how to prepare and give this medicine. Use exactly as directed. Take your medicine at regular intervals. Do not take your medicine more often than directed. A special MedGuide will be given to you by the pharmacist with each prescription and refill. Be sure to read this information carefully each time. It is important that you put your used needles and syringes in a special sharps container. Do not put them in a trash can. If you do not have a sharps container, call your pharmacist or healthcare provider to get one. Talk to your pediatrician regarding the use of this medicine in children. While this drug may be prescribed for children as young as 2 years for selected conditions, precautions do apply. The manufacturer of the medicine offers free  information to patients and their health care partners. Call 1-800-448-6472 for more information. Overdosage: If you think you have taken too much of this medicine contact a poison control center or emergency room at once. NOTE: This medicine is only for you. Do not share this medicine with others. What if I miss a dose? If you miss a dose, take it as soon as you can. If it is almost time for your next dose, take only that dose. Do not take double or extra doses. Give the next dose when your next scheduled dose is due. Call your doctor or health care professional if you are not sure how to handle a missed dose. What may interact with this medicine? Do not take this medicine with any of the following medications: -abatacept -anakinra -etanercept -infliximab -live virus vaccines -rilonacept This medicine may also interact with the following medications: -vaccines This list may not describe all possible interactions. Give your health care provider a list of all the medicines, herbs, non-prescription drugs, or dietary supplements you use. Also tell them if you smoke, drink alcohol, or use illegal drugs. Some items may interact with your medicine. What should I watch for while using this medicine? Visit your doctor or health care professional for regular checks on your progress. Tell your doctor or healthcare professional if your symptoms do not start to get better or if they get worse. You will be tested for tuberculosis (TB) before you start this medicine. If your doctor prescribes any medicine for TB, you should start taking the TB medicine before starting this medicine. Make sure to finish the full course of TB medicine. Call your doctor or health care professional if you get a cold   or other infection while receiving this medicine. Do not treat yourself. This medicine may decrease your body's ability to fight infection. Talk to your doctor about your risk of cancer. You may be more at risk for  certain types of cancers if you take this medicine. What side effects may I notice from receiving this medicine? Side effects that you should report to your doctor or health care professional as soon as possible: -allergic reactions like skin rash, itching or hives, swelling of the face, lips, or tongue -breathing problems -changes in vision -chest pain -fever, chills, or any other sign of infection -numbness or tingling -red, scaly patches or raised bumps on the skin -swelling of the ankles -swollen lymph nodes in the neck, underarm, or groin areas -unexplained weight loss -unusual bleeding or bruising -unusually weak or tired Side effects that usually do not require medical attention (report to your doctor or health care professional if they continue or are bothersome): -headache -nausea -redness, itching, swelling, or bruising at site where injected This list may not describe all possible side effects. Call your doctor for medical advice about side effects. You may report side effects to FDA at 1-800-FDA-1088. Where should I keep my medicine? Keep out of the reach of children. Store in the original container and in the refrigerator between 2 and 8 degrees C (36 and 46 degrees F). Do not freeze. The product may be stored in a cool carrier with an ice pack, if needed. Protect from light. Throw away any unused medicine after the expiration date. NOTE: This sheet is a summary. It may not cover all possible information. If you have questions about this medicine, talk to your doctor, pharmacist, or health care provider.  2018 Elsevier/Gold Standard (2015-05-22 11:11:43)  

## 2017-12-07 NOTE — Telephone Encounter (Addendum)
Called [employer] to check status of PA. Spoke with Gene who states that the pt was denied because she has not tried or failed MTX or any DMARD. Patient is currently failing MTX. A new Berkley Harvey was submitted over the phone. We should have a response within 24 hours.   PA: [id number] Interaction ID: [id number]  Will update once we have a response.  Annalicia Renfrew, [place], CPhT 2:03 PM

## 2017-12-08 NOTE — Telephone Encounter (Signed)
Called [employer] to check the status of pts authorization. Spoke with Durward Mallard who states the Berkley Harvey has been approved  from 12/07/17 to 12/02/2018.   Reference number:[id number] Phone number:[phone]  Will send document to scan center.  Called pt to give update. Phone line was busy. Pt will need new rx sent to pharmacy and schedule a nursing visit for first dose.   Zoe Goonan, [place], CPhT 3:37 PM

## 2017-12-09 ENCOUNTER — Telehealth: Payer: Self-pay | Admitting: Rheumatology

## 2017-12-09 NOTE — Telephone Encounter (Signed)
Patient does not feel comfortable starting injections. Patient feels better with 1 tablet of meds x4 days now. Patient was not taking correctly, please advise. Also, Humira injections were declined by patients insurance. Please call to discuss with friend.

## 2017-12-09 NOTE — Telephone Encounter (Signed)
Spoke with patient's friend Dallas Breeding who speaks [place]. She states patient is unsure if she wants to start the Humira. [name] is going to toalk to her again regarding this and will call back if we can use the [facility] out patient pharmacy for her speciality pharmacy.

## 2017-12-09 NOTE — Telephone Encounter (Signed)
Patient does not feel comfortable taking the Humira. Patient was not taking her Prednisone correctly but is taking it correctly now. [name] will talk to the patient and advise her that she needs to use something along with the Humira to help with her RA. She will call back to let us know if we should send prescription in.

## 2017-12-10 MED ORDER — ADALIMUMAB 40 MG/0.4ML ~~LOC~~ AJKT: 40.0000 mg | AUTO-INJECTOR | SUBCUTANEOUS | 0 refills | Status: DC

## 2017-12-10 NOTE — Addendum Note (Signed)
Addended by: Henriette Combs on: 12/10/2017 10:39 AM   Modules accepted: Orders

## 2017-12-10 NOTE — Telephone Encounter (Signed)
Prescription sent to the pharmacy and will schedule nurse visit once medication is in office

## 2017-12-10 NOTE — Telephone Encounter (Signed)
Spoke with [name] who states that pt would like to move forward with using Humira. We will send Rx to [facility] to have pts meds delivered to her home. The first refill will be delivered to the clinic. Once received we will contact pt to schedule nursing visit.   Please send Rx for HUMIRA 40mg /0.1ml to [facility]. Thanks!  Gabrien Mentink, [place], CPhT 10:35 AM

## 2017-12-13 ENCOUNTER — Telehealth: Payer: Self-pay

## 2017-12-13 NOTE — Telephone Encounter (Signed)
Called patient using a translator to set up shipment of medication from [facility]. Had to leave a message.   Ranell Finelli, [place], CPhT 2:59 PM

## 2017-12-16 MED FILL — HUMIRA PEN 40 MG/0.4ML PNKT: 40 | 28 days supply | Qty: 2 | Fill #0

## 2017-12-17 ENCOUNTER — Telehealth: Payer: Self-pay

## 2017-12-17 NOTE — Telephone Encounter (Signed)
Coordinated delivery of HUMIRA from pharmacy.She is currently taking MTX and folic acid. Please schedule nursing visit. Thanks!  Mavi Un, [place], CPhT 3:22 PM

## 2017-12-17 NOTE — Telephone Encounter (Signed)
Attempted to contact the patient and number was busy.

## 2017-12-23 ENCOUNTER — Other Ambulatory Visit: Payer: Self-pay | Admitting: Rheumatology

## 2017-12-24 ENCOUNTER — Other Ambulatory Visit: Payer: Self-pay | Admitting: Rheumatology

## 2017-12-24 NOTE — Telephone Encounter (Signed)
Patient's contact person Dallas Breeding is returning your call about scheduling a time for patient to come in for her Humira shot.  Please call her at [phone]

## 2017-12-24 NOTE — Telephone Encounter (Signed)
Last Visit: 12/02/17 Next Visit: 02/25/18 Labs: 11/26/17 WNL  Okay to refill per Dr. Corliss Skains

## 2017-12-24 NOTE — Telephone Encounter (Signed)
Patient has been scheduled for 01/05/18 at 3:00 pm.

## 2017-12-24 NOTE — Telephone Encounter (Signed)
Attempted to contact Ashley Bates to schedule Ashley Bates for Humira injection. Left message for her to contact the office to schedule.

## 2018-01-05 ENCOUNTER — Other Ambulatory Visit: Payer: Self-pay | Admitting: *Deleted

## 2018-01-05 ENCOUNTER — Ambulatory Visit (INDEPENDENT_AMBULATORY_CARE_PROVIDER_SITE_OTHER): Payer: Medicaid Other | Admitting: *Deleted

## 2018-01-05 VITALS — BP 111/67 | HR 65

## 2018-01-05 DIAGNOSIS — M0579 Rheumatoid arthritis with rheumatoid factor of multiple sites without organ or systems involvement: Secondary | ICD-10-CM | POA: Diagnosis not present

## 2018-01-05 MED ORDER — FOLIC ACID 1 MG PO TABS: 2.0000 mg | ORAL_TABLET | Freq: Every day | ORAL | 4 refills | Status: DC

## 2018-01-05 MED ORDER — ADALIMUMAB 40 MG/0.4ML ~~LOC~~ AJKT
40.0000 mg | AUTO-INJECTOR | Freq: Once | SUBCUTANEOUS | Status: AC
  Administered 2018-01-05: 40 mg via SUBCUTANEOUS

## 2018-01-05 NOTE — Patient Instructions (Signed)
Standing Labs We placed an order today for your standing lab work.    Please come back and get your standing labs in 1 month and every 3 months.   We have open lab Monday through Friday from 8:30-11:30 AM and 1:30-4 PM at the office of Dr. Pollyann Savoy.   The office is located at [address], [place], [place] [address] No appointment is necessary.   Labs are drawn by [facility].  You may receive a bill from [facility] for your lab work. If you have any questions regarding directions or hours of operation,  please call [phone].    Humira Injections:  January 19, 2018 February 02, 2018 February 16, 2018 March 02, 2018 Mar 16, 2018 Mar 30, 2018 Apr 13, 2018  If you have a reaction at the injection site you may use Zyrtec and Zantac as well as Hydrocortisone Cream for the itching. If this happens you will want to use this the day before the day of and the day after injection with the following injection.

## 2018-01-05 NOTE — Telephone Encounter (Signed)
Refill request received via fax.   Last Visit: 12/02/17 Next Visit: 02/25/18  Okay to refill per Dr. Corliss Skains

## 2018-01-05 NOTE — Progress Notes (Signed)
Patient in office for new start to Humira. Patient provided her medication. Patient was given a demonstration on the proper technique on how to use the Humira pen. Patient was able to demonstrate the proper technique. Patient was given injection in her right thigh. Patient tolerated injection well. Patient monitored in office for 30 minutes after injection for adverse reactions. No adverse reactions noted.   Administrations This Visit    Adalimumab PNKT 40 mg    Admin Date 01/05/2018 Action Given Dose 40 mg Route Subcutaneous Administered By Henriette Combs, LPN

## 2018-01-06 ENCOUNTER — Other Ambulatory Visit: Payer: Self-pay

## 2018-01-06 ENCOUNTER — Ambulatory Visit (HOSPITAL_COMMUNITY)
Admission: EM | Admit: 2018-01-06 | Discharge: 2018-01-06 | Disposition: A | Discharge status: Home / Self Care | Payer: Medicaid Other | Attending: Family Medicine | Admitting: Family Medicine

## 2018-01-06 ENCOUNTER — Encounter (HOSPITAL_COMMUNITY): Payer: Self-pay | Admitting: Emergency Medicine

## 2018-01-06 DIAGNOSIS — E039 Hypothyroidism, unspecified: Secondary | ICD-10-CM

## 2018-01-06 DIAGNOSIS — R51 Headache: Secondary | ICD-10-CM | POA: Diagnosis not present

## 2018-01-06 DIAGNOSIS — R519 Headache, unspecified: Secondary | ICD-10-CM

## 2018-01-06 MED ORDER — ONDANSETRON 8 MG PO TBDP: 8.0000 mg | ORAL_TABLET | Freq: Three times a day (TID) | ORAL | 0 refills | Status: DC | PRN

## 2018-01-06 MED ORDER — LEVOTHYROXINE SODIUM 50 MCG PO TABS: 50.0000 ug | ORAL_TABLET | Freq: Every day | ORAL | 1 refills | Status: DC

## 2018-01-06 MED ORDER — BUTALBITAL-APAP-CAFFEINE 50-325-40 MG PO TABS: 1.0000 | ORAL_TABLET | Freq: Four times a day (QID) | ORAL | 0 refills | Status: DC | PRN

## 2018-01-06 MED ORDER — METHOTREXATE 2.5 MG PO TABS: 20.0000 mg | ORAL_TABLET | ORAL | 2 refills | Status: AC

## 2018-01-06 NOTE — ED Triage Notes (Addendum)
Complains of nausea, headache, and dizziness that started today  Patient says she was told by provider that gave humira, not to take ibuprofen or antibiotics.  Yesterday was the first dose  Ashley Bates #[id number]

## 2018-01-06 NOTE — ED Provider Notes (Signed)
[facility]   [id number] 01/06/18 Arrival Time: 1723   SUBJECTIVE:  Ashley Bates is a 57 y.o. female who presents to the urgent care with complaint of nausea, headache, and dizziness that started today  Patient says she was told by provider that gave humira, not to take ibuprofen or antibiotics.  Yesterday was the first dose  Past Medical History:  Diagnosis Date  . Arthritis   . Back pain   . DDD (degenerative disc disease), cervical 10/06/2016  . DDD (degenerative disc disease), lumbar 10/06/2016  . 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 Post-surgical hypothyroidism   . Scoliosis   . Spinal stenosis   . Vitamin D deficiency 10/06/2016   Family History  Problem Relation Age of Onset  . Diabetes Neg Hx   . Hyperlipidemia Neg Hx   . Hypertension Neg Hx   . Heart failure Neg Hx    Social History   Socioeconomic History  . Marital status: Married    Spouse name: Not on file  . Number of children: Not on file  . Years of education: Not on file  . Highest education level: Not on file  Social Needs  . Financial resource strain: Not on file  . Food insecurity - worry: Not on file  . Food insecurity - inability: Not on file  . Transportation needs - medical: Not on file  . Transportation needs - non-medical: Not on file  Occupational History  . Not on file  Tobacco Use  . Smoking status: Former Games developer  . Smokeless tobacco: Never Used  Substance and Sexual Activity  . Alcohol use: No  . Drug use: No  . Sexual activity: Yes    Birth control/protection: Post-menopausal  Other Topics Concern  . Not on file  Social History Narrative   Immigrant Social History:   - Date arrived in [place]: 01/18/14   - Country of origin: [place]   - Location of refugee camp (if applicable), how long there, and what caused patient to leave home country?: No camp, was able to flee country straight to [place]   - Primary language: Arabic   -Requires intepreter  (essentially speaks no [place])   - Tobacco/alcohol/drug use: denies   - Marriage Status: Married. With 1 son   No outpatient medications have been marked as taking for the 01/06/18 encounter [facility] Encounter).   No Known Allergies    ROS: As per HPI, remainder of ROS negative.   OBJECTIVE:   Vitals:   01/06/18 1754  BP: 116/74  Pulse: 60  Resp: 16  Temp: (!) 97.4 F (36.3 C)  TempSrc: Oral  SpO2: 96%     General appearance: alert; no distress Eyes: PERRL; EOMI; conjunctiva normal HENT: normocephalic; atraumatic; TMs normal, canal normal, external ears normal without trauma; nasal mucosa normal; oral mucosa normal Neck: supple Lungs: clear to auscultation bilaterally Heart: regular rate and rhythm Back: no CVA tenderness Extremities: no cyanosis or edema; symmetrical with no gross deformities Skin: warm and dry Neurologic: normal gait; grossly normal Psychological: alert and cooperative; normal mood and affect      Labs:  Results for orders placed or performed in visit on 11/26/17  COMPLETE METABOLIC PANEL WITH GFR  Result Value Ref Range   Glucose, Bld 91 65 - 99 mg/dL   BUN 10 7 - 25 mg/dL   Creat 2.44 9.75 - 3.00 mg/dL   GFR, Est Non African American 93 > OR = 60 mL/min/1.60m2   GFR, Est African  American 108 > OR = 60 mL/min/1.62m2   BUN/Creatinine Ratio NOT APPLICABLE 6 - 22 (calc)   Sodium 140 135 - 146 mmol/L   Potassium 4.8 3.5 - 5.3 mmol/L   Chloride 104 98 - 110 mmol/L   CO2 29 20 - 32 mmol/L   Calcium 9.3 8.6 - 10.4 mg/dL   Total Protein 7.4 6.1 - 8.1 g/dL   Albumin 4.3 3.6 - 5.1 g/dL   Globulin 3.1 1.9 - 3.7 g/dL (calc)   AG Ratio 1.4 1.0 - 2.5 (calc)   Total Bilirubin 0.6 0.2 - 1.2 mg/dL   Alkaline phosphatase (APISO) 68 33 - 130 U/L   AST 20 10 - 35 U/L   ALT 16 6 - 29 U/L  CBC with Differential/Platelet  Result Value Ref Range   WBC 6.9 3.8 - 10.8 Thousand/uL   RBC 4.35 3.80 - 5.10 Million/uL   Hemoglobin 12.5 11.7 - 15.5 g/dL    HCT 74.9 44.9 - 67.5 %   MCV 85.3 80.0 - 100.0 fL   MCH 28.7 27.0 - 33.0 pg   MCHC 33.7 32.0 - 36.0 g/dL   RDW 91.6 38.4 - 66.5 %   Platelets 232 140 - 400 Thousand/uL   MPV 11.9 7.5 - 12.5 fL   Neutro Abs 4,023 1,500 - 7,800 cells/uL   Lymphs Abs 2,263 850 - 3,900 cells/uL   WBC mixed population 462 200 - 950 cells/uL   Eosinophils Absolute 90 15 - 500 cells/uL   Basophils Absolute 62 0 - 200 cells/uL   Neutrophils Relative % 58.3 %   Total Lymphocyte 32.8 %   Monocytes Relative 6.7 %   Eosinophils Relative 1.3 %   Basophils Relative 0.9 %    Labs Reviewed - No data to display  No results found.     ASSESSMENT & PLAN:  1. Bad headache   2. Hypothyroidism, unspecified type     Meds ordered this encounter  Medications  . levothyroxine (SYNTHROID, LEVOTHROID) 50 MCG tablet    Sig: Take 1 tablet (50 mcg total) by mouth daily before breakfast. khudh 1 qurs yawmiaan 30 daqiqat qabl al'iiftar.    Dispense:  90 tablet    Refill:  1  . methotrexate (RHEUMATREX) 2.5 MG tablet    Sig: Take 8 tablets (20 mg total) by mouth once a week. Caution:Chemotherapy. Protect from light.    Dispense:  32 tablet    Refill:  2  . butalbital-acetaminophen-caffeine (FIORICET, ESGIC) 50-325-40 MG tablet    Sig: Take 1-2 tablets by mouth every 6 (six) hours as needed for headache.    Dispense:  20 tablet    Refill:  0  . ondansetron (ZOFRAN-ODT) 8 MG disintegrating tablet    Sig: Take 1 tablet (8 mg total) by mouth every 8 (eight) hours as needed for nausea.    Dispense:  30 tablet    Refill:  0    Reviewed expectations re: course of current medical issues. Questions answered. Outlined signs and symptoms indicating need for more acute intervention. Patient verbalized understanding. After Visit Summary given.    Procedures:      Elvina Sidle, MD 01/06/18 9935

## 2018-01-24 MED FILL — HUMIRA PEN 40 MG/0.4ML PNKT: 40 | 28 days supply | Qty: 2 | Fill #1

## 2018-02-03 ENCOUNTER — Other Ambulatory Visit: Payer: Self-pay | Admitting: *Deleted

## 2018-02-03 DIAGNOSIS — Z79899 Other long term (current) drug therapy: Secondary | ICD-10-CM

## 2018-02-03 LAB — CBC WITH DIFFERENTIAL/PLATELET
BASOS PCT: 0.8 %
Basophils Absolute: 58 cells/uL (ref 0–200)
Eosinophils Absolute: 108 cells/uL (ref 15–500)
Eosinophils Relative: 1.5 %
HCT: 36.5 % (ref 35.0–45.0)
Hemoglobin: 12.3 g/dL (ref 11.7–15.5)
Lymphs Abs: 3622 cells/uL (ref 850–3900)
MCH: 29.7 pg (ref 27.0–33.0)
MCHC: 33.7 g/dL (ref 32.0–36.0)
MCV: 88.2 fL (ref 80.0–100.0)
MONOS PCT: 5.5 %
MPV: 11.7 fL (ref 7.5–12.5)
NEUTROS ABS: 3017 {cells}/uL (ref 1500–7800)
Neutrophils Relative %: 41.9 %
PLATELETS: 194 10*3/uL (ref 140–400)
RBC: 4.14 10*6/uL (ref 3.80–5.10)
RDW: 15.2 % — ABNORMAL HIGH (ref 11.0–15.0)
TOTAL LYMPHOCYTE: 50.3 %
WBC mixed population: 396 cells/uL (ref 200–950)
WBC: 7.2 10*3/uL (ref 3.8–10.8)

## 2018-02-03 LAB — COMPLETE METABOLIC PANEL WITH GFR
AG Ratio: 1.5 (calc) (ref 1.0–2.5)
ALT: 118 U/L — AB (ref 6–29)
AST: 82 U/L — AB (ref 10–35)
Albumin: 4.3 g/dL (ref 3.6–5.1)
Alkaline phosphatase (APISO): 61 U/L (ref 33–130)
BUN: 12 mg/dL (ref 7–25)
CALCIUM: 9.4 mg/dL (ref 8.6–10.4)
CO2: 30 mmol/L (ref 20–32)
CREATININE: 0.77 mg/dL (ref 0.50–1.05)
Chloride: 103 mmol/L (ref 98–110)
GFR, Est African American: 99 mL/min/{1.73_m2} (ref >=60)
GFR, Est Non African American: 86 mL/min/{1.73_m2} (ref >=60)
GLOBULIN: 2.9 g/dL (ref 1.9–3.7)
Glucose, Bld: 94 mg/dL (ref 65–99)
POTASSIUM: 4.7 mmol/L (ref 3.5–5.3)
SODIUM: 139 mmol/L (ref 135–146)
Total Bilirubin: 0.4 mg/dL (ref 0.2–1.2)
Total Protein: 7.2 g/dL (ref 6.1–8.1)

## 2018-02-04 NOTE — Progress Notes (Signed)
LFTs elevated after adding Humira.  Will discontinue methotrexate and repeat LFTs in 2 weeks.

## 2018-02-11 NOTE — Progress Notes (Deleted)
Office Visit Note  Patient: Ashley Bates             Date of Birth: 12-04-1960           MRN: [id number]             PCP: Casey Burkitt, MD Referring: Erlinda Hong* Visit Date: 02/25/2018 Occupation: @GUAROCC @    Subjective:  No chief complaint on file.   History of Present Illness: Ashley Bates is a 57 y.o. female ***   Activities of Daily Living:  Patient reports morning stiffness for *** {minute/hour:19697}.   Patient {ACTIONS;DENIES/REPORTS:21021675::"Denies"} nocturnal pain.  Difficulty dressing/grooming: {ACTIONS;DENIES/REPORTS:21021675::"Denies"} Difficulty climbing stairs: {ACTIONS;DENIES/REPORTS:21021675::"Denies"} Difficulty getting out of chair: {ACTIONS;DENIES/REPORTS:21021675::"Denies"} Difficulty using hands for taps, buttons, cutlery, and/or writing: {ACTIONS;DENIES/REPORTS:21021675::"Denies"}   No Rheumatology ROS completed.   PMFS History:  Patient Active Problem List   Diagnosis Date Noted  . Cervical cancer screening 06/30/2017  . Hypothyroidism 06/30/2017  . Hospital discharge follow-up 06/01/2017  . Fever   . SIRS (systemic inflammatory response syndrome) (HCC) 05/17/2017  . Healthcare maintenance 04/08/2017  . High risk medication use 10/30/2016  . DDD lumbar spine 10/30/2016  . Neuropathy 10/30/2016  . ANA positive 10/28/2016  . DJD (degenerative joint disease), cervical 10/06/2016  . DDD (degenerative disc disease), lumbar 10/06/2016  . 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 Vitamin D deficiency 10/06/2016  . Pelvic pain in female 02/29/2016  . Elevated TSH 07/16/2015  . Spinal stenosis of lumbar region 03/21/2015  . Left foot drop 03/07/2015  . Lumbago 03/07/2015  . Refugee health examination 03/07/2015  . Glaucoma 03/07/2015  . RA (rheumatoid arthritis) (HCC) 03/04/2015    Past Medical History:  Diagnosis Date  . Arthritis   . Back pain   . DDD (degenerative disc disease), cervical 10/06/2016  . DDD  (degenerative disc disease), lumbar 10/06/2016  . 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 Post-surgical hypothyroidism   . Scoliosis   . Spinal stenosis   . Vitamin D deficiency 10/06/2016    Family History  Problem Relation Age of Onset  . Diabetes Neg Hx   . Hyperlipidemia Neg Hx   . Hypertension Neg Hx   . Heart failure Neg Hx    Past Surgical History:  Procedure Laterality Date  . BACK SURGERY     x 3 (last in 2009)  . CARPAL TUNNEL RELEASE     2007  . CATARACT EXTRACTION, BILATERAL Bilateral   . LUMBAR DISC SURGERY    . THYROIDECTOMY     2006   Social History   Social History Narrative   Immigrant Social History:   - Date arrived in 2007: 01/18/14   - Country of origin[place]   - Location of refugee camp (if applicable), how long there, and what caused patient to leave home country?: No camp, was able to flee country straight to [place]   - Primary language: Arabic   -Requires intepreter (essentially speaks no [place])   - Tobacco/alcohol/drug use: denies   - Marriage Status: Married. With 1 son     Objective: Vital Signs: There were no vitals taken for this visit.   Physical Exam   Musculoskeletal Exam: ***  CDAI Exam: No CDAI exam completed.    Investigation: No additional findings. CBC Latest Ref Rng & Units 02/03/2018 11/26/2017 08/06/2017  WBC 3.8 - 10.8 Thousand/uL 7.2 6.9 7.3  Hemoglobin 11.7 - 15.5 g/dL 08/08/2017 21.3 08.6  Hematocrit 35.0 - 45.0 % 36.5 37.1 36.6  Platelets 140 -  400 Thousand/uL 194 232 210   CMP Latest Ref Rng & Units 02/03/2018 11/26/2017 08/06/2017  Glucose 65 - 99 mg/dL 94 91 88  BUN 7 - 25 mg/dL 12 10 11   Creatinine 0.50 - 1.05 mg/dL 7.01 4.10  Sodium 135 - 146 mmol/L 139 140 140  Potassium 3.5 - 5.3 mmol/L 4.7 4.8 4.6  Chloride 98 - 110 mmol/L 103 104 104  CO2 20 - 32 mmol/L 30 29 27   Calcium 8.6 - 10.4 mg/dL 9.4 9.3 9.2  Total Protein 6.1 - 8.1 g/dL 7.2 7.4 7.5  Total Bilirubin 0.2 - 1.2 mg/dL 0.4 0.6 0.5    Alkaline Phos 38 - 126 U/L - - -  AST 10 - 35 U/L 82(H) 20 20  ALT 6 - 29 U/L 118(H) 16 15    Imaging: No results found.  Speciality Comments: No specialty comments available.    Procedures:  No procedures performed Allergies: Patient has no known allergies.   Assessment / Plan:     Visit Diagnoses: Rheumatoid arthritis involving multiple sites with positive rheumatoid factor (HCC)  High risk medication use - Humira, 02/03/18 LFTs elevated-d/c MTX (previously taking 8 tablets weekly)   Primary osteoarthritis of both hands  DDD (degenerative disc disease), cervical  DDD (degenerative disc disease), lumbar  Carpal tunnel syndrome, bilateral - Release of right previously.  + Phalen's and Tinel's sign.  Bilateral carpal tunnel braces.   Left foot drop  History of hypothyroidism  History of thyroidectomy  History of glaucoma  History of vitamin D deficiency  Peroneal neuropathy, unspecified laterality    Orders: No orders of the defined types were placed in this encounter.  No orders of the defined types were placed in this encounter.   Face-to-face time spent with patient was *** minutes. 50% of time was spent in counseling and coordination of care.  Follow-Up Instructions: No follow-ups on file.   , PA-C  Note - This record has been created using Dragon software.  Chart creation errors have been sought, but may not always  have been located. Such creation errors do not reflect on  the standard of medical care.

## 2018-02-12 ENCOUNTER — Other Ambulatory Visit: Payer: Self-pay

## 2018-02-12 ENCOUNTER — Ambulatory Visit (HOSPITAL_COMMUNITY)
Admission: EM | Admit: 2018-02-12 | Discharge: 2018-02-12 | Disposition: A | Discharge status: Home / Self Care | Payer: Medicaid Other | Attending: Radiology | Admitting: Radiology

## 2018-02-12 ENCOUNTER — Encounter (HOSPITAL_COMMUNITY): Payer: Self-pay | Admitting: Emergency Medicine

## 2018-02-12 DIAGNOSIS — J069 Acute upper respiratory infection, unspecified: Secondary | ICD-10-CM

## 2018-02-12 DIAGNOSIS — R05 Cough: Secondary | ICD-10-CM | POA: Diagnosis not present

## 2018-02-12 DIAGNOSIS — R0602 Shortness of breath: Secondary | ICD-10-CM

## 2018-02-12 MED ORDER — FLUTICASONE PROPIONATE 50 MCG/ACT NA SUSP: 1.0000 | Freq: Every day | NASAL | 0 refills | Status: DC

## 2018-02-12 MED ORDER — BENZONATATE 100 MG PO CAPS: 100.0000 mg | ORAL_CAPSULE | Freq: Three times a day (TID) | ORAL | 0 refills | Status: DC

## 2018-02-12 MED ORDER — LEVOTHYROXINE SODIUM 50 MCG PO TABS: 50.0000 ug | ORAL_TABLET | Freq: Every day | ORAL | 1 refills | Status: AC

## 2018-02-12 MED ORDER — ALBUTEROL SULFATE (2.5 MG/3ML) 0.083% IN NEBU
2.5000 mg | INHALATION_SOLUTION | Freq: Once | RESPIRATORY_TRACT | Status: AC
  Administered 2018-02-12: 2.5 mg via RESPIRATORY_TRACT

## 2018-02-12 MED ORDER — ALBUTEROL SULFATE (2.5 MG/3ML) 0.083% IN NEBU
INHALATION_SOLUTION | RESPIRATORY_TRACT | Status: AC
  Filled 2018-02-12: qty 3

## 2018-02-12 MED ORDER — CETIRIZINE HCL 10 MG PO TABS
10.0000 mg | ORAL_TABLET | Freq: Every day | ORAL | 0 refills | Status: DC
Start: 2018-02-12 — End: 2018-03-01

## 2018-02-12 NOTE — Discharge Instructions (Addendum)
Continue to push fluids and take over the counter medications as directed on the back of the box for symptomatic relief.  ° °

## 2018-02-12 NOTE — ED Provider Notes (Signed)
[facility]    CSN: [id number] Arrival date & time: 02/12/18  1639     History   Chief Complaint Chief Complaint  Patient presents with  . URI    HPI Ashley Bates is a 57 y.o. female.   57 y.o. female presents with nasal congestion, sore throat, SHOB, cough, and sinus pressure X 1 week . Condition is acute in nature. Condition is made better as the day progresses. Condition is made worse by laying down. Patient denies any treatment prior to there arrival at this facility. Patient states that she was given her humera prescription in place of her synthroid. Patient is requesting refill for synthroid at this time. Patient declines antibiotic at this time.   Breathing improved with albuterol treamtent     Past Medical History:  Diagnosis Date  . Arthritis   . Back pain   . DDD (degenerative disc disease), cervical 10/06/2016  . DDD (degenerative disc disease), lumbar 10/06/2016  . 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 Post-surgical hypothyroidism   . Scoliosis   . Spinal stenosis   . Vitamin D deficiency 10/06/2016    Patient Active Problem List   Diagnosis Date Noted  . Cervical cancer screening 06/30/2017  . Hypothyroidism 06/30/2017  . Hospital discharge follow-up 06/01/2017  . Fever   . SIRS (systemic inflammatory response syndrome) (HCC) 05/17/2017  . Healthcare maintenance 04/08/2017  . High risk medication use 10/30/2016  . DDD lumbar spine 10/30/2016  . Neuropathy 10/30/2016  . ANA positive 10/28/2016  . DJD (degenerative joint disease), cervical 10/06/2016  . DDD (degenerative disc disease), lumbar 10/06/2016  . 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 Vitamin D deficiency 10/06/2016  . Pelvic pain in female 02/29/2016  . Elevated TSH 07/16/2015  . Spinal stenosis of lumbar region 03/21/2015  . Left foot drop 03/07/2015  . Lumbago 03/07/2015  . Refugee health examination 03/07/2015  . Glaucoma 03/07/2015  . RA (rheumatoid arthritis)  (HCC) 03/04/2015    Past Surgical History:  Procedure Laterality Date  . BACK SURGERY     x 3 (last in 2009)  . CARPAL TUNNEL RELEASE     2007  . CATARACT EXTRACTION, BILATERAL Bilateral   . LUMBAR DISC SURGERY    . THYROIDECTOMY     2006    OB History   None      Home Medications    Prior to Admission medications   Medication Sig Start Date End Date Taking? Authorizing Provider  Adalimumab (HUMIRA PEN) 40 MG/0.4ML PNKT Inject 40 mg into the skin every 14 (fourteen) days. 12/10/17  Yes Deveshwar, Janalyn Rouse, MD  butalbital-acetaminophen-caffeine (FIORICET, ESGIC) [phone] MG tablet Take 1-2 tablets by mouth every 6 (six) hours as needed for headache. 01/06/18 01/06/19 Yes Elvina Sidle, MD  methotrexate (RHEUMATREX) 2.5 MG tablet Take 8 tablets (20 mg total) by mouth once a week. Caution:Chemotherapy. Protect from light. 01/06/18  Yes Elvina Sidle, MD  folic acid (FOLVITE) 1 MG tablet Take 2 tablets (2 mg total) by mouth daily. 01/05/18   Pollyann Savoy, MD  levothyroxine (SYNTHROID, LEVOTHROID) 50 MCG tablet Take 1 tablet (50 mcg total) by mouth daily before breakfast. khudh 1 qurs yawmiaan 30 daqiqat qabl al'iiftar. 01/06/18 02/05/18  Elvina Sidle, MD  Multiple Vitamins-Minerals (WOMENS MULTIVITAMIN) TABS Take 1 tablet by mouth daily. 06/29/17   Casey Burkitt, MD  ondansetron (ZOFRAN-ODT) 8 MG disintegrating tablet Take 1 tablet (8 mg total) by mouth every 8 (eight) hours as needed for nausea. 01/06/18  Elvina Sidle, MD  traZODone (DESYREL) 50 MG tablet TAKE 1 TABLET (50 MG TOTAL) AT BEDTIME AS NEEDED BY MOUTH FOR SLEEP. 11/18/17   Casey Burkitt, MD    Family History Family History  Problem Relation Age of Onset  . Diabetes Neg Hx   . Hyperlipidemia Neg Hx   . Hypertension Neg Hx   . Heart failure Neg Hx     Social History Social History   Tobacco Use  . Smoking status: Former Games developer  . Smokeless tobacco: Never Used  Substance Use Topics    . Alcohol use: No  . Drug use: No     Allergies   Patient has no known allergies.   Review of Systems Review of Systems  Constitutional: Negative for chills and fever.  HENT: Positive for congestion, sinus pressure and sore throat. Negative for ear pain.   Eyes: Negative for pain and visual disturbance.  Respiratory: Positive for cough and shortness of breath.   Cardiovascular: Negative for chest pain and palpitations.  Gastrointestinal: Negative for abdominal pain and vomiting.  Genitourinary: Negative for dysuria and hematuria.  Musculoskeletal: Negative for arthralgias and back pain.  Skin: Negative for color change and rash.  Neurological: Negative for seizures and syncope.  All other systems reviewed and are negative.    Physical Exam Triage Vital Signs ED Triage Vitals [02/12/18 1710]  Enc Vitals Group     BP 95/69     Pulse Rate 95     Resp      Temp 99 F (37.2 C)     Temp Source Oral     SpO2 99 %     Weight      Height      Head Circumference      Peak Flow      Pain Score      Pain Loc      Pain Edu?      Excl. in GC?    No data found.  Updated Vital Signs BP 95/69 (BP Location: Left Arm)   Pulse 95   Temp 99 F (37.2 C) (Oral)   SpO2 99%   Visual Acuity Right Eye Distance:   Left Eye Distance:   Bilateral Distance:    Right Eye Near:   Left Eye Near:    Bilateral Near:     Physical Exam  Constitutional: She is oriented to person, place, and time. She appears well-developed and well-nourished.  HENT:  Head: Normocephalic and atraumatic.  Nose: Rhinorrhea present.  Mouth/Throat: Posterior oropharyngeal erythema present.  Eyes: Conjunctivae are normal.  Neck: Normal range of motion.  Pulmonary/Chest: Effort normal.  Tightness noted to left apex  Musculoskeletal: Normal range of motion.  Neurological: She is alert and oriented to person, place, and time.  Skin: Skin is warm.  Psychiatric: She has a normal mood and affect.  Nursing  note and vitals reviewed.    UC Treatments / Results  Labs (all labs ordered are listed, but only abnormal results are displayed) Labs Reviewed - No data to display  EKG None Radiology No results found.  Procedures Procedures (including critical care time)  Medications Ordered in UC Medications - No data to display   Initial Impression / Assessment and Plan / UC Course  I have reviewed the triage vital signs and the nursing notes.  Pertinent labs & imaging results that were available during my care of the patient were reviewed by me and considered in my medical decision making (see chart for  details).       Final Clinical Impressions(s) / UC Diagnoses   Final diagnoses:  None    ED Discharge Orders    None       Controlled Substance Prescriptions [place] Controlled Substance Registry consulted? Not Applicable   Alene Mires, NP 02/12/18 1907

## 2018-02-12 NOTE — ED Triage Notes (Addendum)
Pt reports a strong cough, sore throat, swelling in her throat and congestion for one week.  There is a lot of confusion about what medications the pt should be taking.  What she is reporting and what the chart states are two different things.

## 2018-02-16 ENCOUNTER — Telehealth: Payer: Self-pay | Admitting: Rheumatology

## 2018-02-16 NOTE — Telephone Encounter (Signed)
[name] called stating that Dr. Corliss Skains wanted patient to stop taking her Methotrexate for 2 weeks and then have her bloodwork done.  Patient mistakenly took her Methotrexate for 3 days last week thinking it was her Thyroid medication.  Patient also went to the doctor's office over the weekend due to cold symptoms and she was put on allergy medication.  She is currently taking Benzonatate,Flonase, and Levothyroxine.  Please call to advise if patient needs to wait for labwork.

## 2018-02-16 NOTE — Telephone Encounter (Signed)
Left message to advise [name] okay for patient to come to have blood work done.

## 2018-02-16 NOTE — Telephone Encounter (Signed)
It is ok to repeat LFTs . I do not see that she is on any meds which should have an effect on her LFTs.

## 2018-02-22 ENCOUNTER — Ambulatory Visit (INDEPENDENT_AMBULATORY_CARE_PROVIDER_SITE_OTHER): Payer: Medicaid Other | Admitting: Family Medicine

## 2018-02-22 DIAGNOSIS — J302 Other seasonal allergic rhinitis: Secondary | ICD-10-CM

## 2018-02-24 MED FILL — HUMIRA PEN 40 MG/0.4ML PNKT: 40 | 28 days supply | Qty: 2 | Fill #2

## 2018-02-25 ENCOUNTER — Ambulatory Visit: Payer: Medicaid Other | Admitting: Physician Assistant

## 2018-02-25 ENCOUNTER — Ambulatory Visit: Payer: Medicaid Other | Admitting: Rheumatology

## 2018-02-28 ENCOUNTER — Other Ambulatory Visit: Payer: Self-pay

## 2018-02-28 ENCOUNTER — Ambulatory Visit: Payer: Medicaid Other | Admitting: Family Medicine

## 2018-02-28 ENCOUNTER — Encounter: Payer: Self-pay | Admitting: Family Medicine

## 2018-02-28 DIAGNOSIS — M0579 Rheumatoid arthritis with rheumatoid factor of multiple sites without organ or systems involvement: Secondary | ICD-10-CM | POA: Diagnosis not present

## 2018-02-28 DIAGNOSIS — J302 Other seasonal allergic rhinitis: Secondary | ICD-10-CM | POA: Diagnosis not present

## 2018-02-28 MED ORDER — CETIRIZINE-PSEUDOEPHEDRINE ER 5-120 MG PO TB12: 1.0000 | ORAL_TABLET | Freq: Two times a day (BID) | ORAL | 1 refills | Status: DC

## 2018-02-28 MED ORDER — TRIAMCINOLONE ACETONIDE 55 MCG/ACT NA AERO: 2.0000 | INHALATION_SPRAY | Freq: Every day | NASAL | 12 refills | Status: AC

## 2018-02-28 MED ORDER — TRAMADOL HCL 50 MG PO TABS: 50.0000 mg | ORAL_TABLET | Freq: Four times a day (QID) | ORAL | 1 refills | Status: AC | PRN

## 2018-02-28 NOTE — Progress Notes (Signed)
Office Visit Note  Patient: Ashley Bates             Date of Birth: [birth year]           MRN: [id number]             PCP: Casey Burkitt, MD Referring: Erlinda Hong* Visit Date: 03/01/2018 Occupation: @GUAROCC @   Interpreter: Subjective:  Muscle aches and fatigue  History of Present Illness: Zeffie Bickert is a 57 y.o. female with history of seropositive rheumatoid arthritis, osteoarthritis, and DDD.  She had lab work drawn on 02/03/2018 which revealed elevated LFTs.  She was advised to hold her methotrexate for 2 weeks and we will recheck LFTs today.  She continues to inject Humira every other week.  She feels that the Humira has been causing increased fatigue and muscle aches in her lower extremities.  She denies missing any doses.  She denies any injection site reactions.  She would like to discontinue Humira and restart on methotrexate.  She reports she has had an upper respiratory tract infection for about 1 month.  She continues to have fatigue and a cough.  She did not hold her dose of Humira.  She denies any joint pain or joint swelling at this time.  She denies any joint stiffness.  She states that she continues to have discomfort in her neck and lower back.    Activities of Daily Living:  Patient reports morning stiffness for 0 minutes.   Patient Denies nocturnal pain.  Difficulty dressing/grooming: Denies Difficulty climbing stairs: Reports Difficulty getting out of chair: Reports Difficulty using hands for taps, buttons, cutlery, and/or writing: Denies   Review of Systems  Constitutional: Positive for fatigue.  HENT: Positive for mouth dryness. Negative for mouth sores and nose dryness.   Eyes: Positive for dryness. Negative for pain and visual disturbance.  Respiratory: Negative for cough, hemoptysis, shortness of breath and difficulty breathing.   Cardiovascular: Negative for chest pain, palpitations, hypertension and  swelling in legs/feet.  Gastrointestinal: Negative for abdominal pain, blood in stool, constipation and diarrhea.  Endocrine: Negative for increased urination.  Genitourinary: Negative for painful urination and pelvic pain.  Musculoskeletal: Negative for arthralgias, joint pain, joint swelling, myalgias, muscle weakness, morning stiffness, muscle tenderness and myalgias.  Skin: Negative for color change, pallor, rash, hair loss, nodules/bumps, skin tightness, ulcers and sensitivity to sunlight.  Allergic/Immunologic: Negative for susceptible to infections.  Neurological: Negative for numbness, headaches and weakness.  Hematological: Negative for bruising/bleeding tendency and swollen glands.  Psychiatric/Behavioral: Negative for depressed mood, confusion and sleep disturbance. The patient is not nervous/anxious.     PMFS History:  Patient Active Problem List   Diagnosis Date Noted  . Seasonal allergies 02/28/2018  . Cervical cancer screening 06/30/2017  . Hypothyroidism 06/30/2017  . Hospital discharge follow-up 06/01/2017  . Fever   . SIRS (systemic inflammatory response syndrome) (HCC) 05/17/2017  . Healthcare maintenance 04/08/2017  . High risk medication use 10/30/2016  . DDD lumbar spine 10/30/2016  . Neuropathy 10/30/2016  . ANA positive 10/28/2016  . DJD (degenerative joint disease), cervical 10/06/2016  . DDD (degenerative disc disease), lumbar 10/06/2016  . 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 Vitamin D deficiency 10/06/2016  . Pelvic pain in female 02/29/2016  . Elevated TSH 07/16/2015  . Spinal stenosis of lumbar region 03/21/2015  . Left foot drop 03/07/2015  . Lumbago 03/07/2015  . Refugee health examination 03/07/2015  . Glaucoma 03/07/2015  . RA (rheumatoid arthritis) (HCC)  03/04/2015    Past Medical History:  Diagnosis Date  . Arthritis   . Back pain   . DDD (degenerative disc disease), cervical 10/06/2016  . DDD (degenerative disc disease), lumbar 10/06/2016    . 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 Post-surgical hypothyroidism   . Scoliosis   . Spinal stenosis   . Vitamin D deficiency 10/06/2016    Family History  Problem Relation Age of Onset  . Diabetes Neg Hx   . Hyperlipidemia Neg Hx   . Hypertension Neg Hx   . Heart failure Neg Hx    Past Surgical History:  Procedure Laterality Date  . BACK SURGERY     x 3 (last in 2009)  . CARPAL TUNNEL RELEASE     2007  . CATARACT EXTRACTION, BILATERAL Bilateral   . LUMBAR DISC SURGERY    . THYROIDECTOMY     2006   Social History   Social History Narrative   Immigrant Social History:   - Date arrived in [place]: 01/18/14   - Country of origin: [place]   - Location of refugee camp (if applicable), how long there, and what caused patient to leave home country?: No camp, was able to flee country straight to [place]   - Primary language: Arabic   -Requires intepreter (essentially speaks no [place])   - Tobacco/alcohol/drug use: denies   - Marriage Status: Married. With 1 son     Objective: Vital Signs: BP 113/84 (BP Location: Right Arm, Patient Position: Sitting, Cuff Size: Normal)   Pulse 74   Resp 15   Ht 5' 4.96" (1.65 m)   Wt 162 lb (73.5 kg)   BMI 26.99 kg/m    Physical Exam  Constitutional: She is oriented to person, place, and time. She appears well-developed and well-nourished.  HENT:  Head: Normocephalic and atraumatic.  Eyes: Conjunctivae and EOM are normal.  Neck: Normal range of motion.  Cardiovascular: Normal rate, regular rhythm, normal heart sounds and intact distal pulses.  Pulmonary/Chest: Effort normal and breath sounds normal.  Abdominal: Soft. Bowel sounds are normal.  Lymphadenopathy:    She has no cervical adenopathy.  Neurological: She is alert and oriented to person, place, and time.  Skin: Skin is warm and dry. Capillary refill takes less than 2 seconds.  Psychiatric: She has a normal mood and affect. Her behavior is normal.  Nursing note and  vitals reviewed.    Musculoskeletal Exam: C-spine, thoracic spine, lumbar spine good range of motion.  No midline spinal tenderness.  No SI joint tenderness.  Shoulder joints, elbow joints, wrist joints, MCPs, PIPs, DIPs good range of motion with no synovitis.  No tenderness of MCP joints.  Hip joints, knee joints, ankle joints, MTPs, PIPs, DIPs good range of motion with no synovitis.  No warmth or effusion of bilateral knees.  No tenderness of MTP joints.  CDAI Exam: CDAI Homunculus Exam:   Joint Counts:  CDAI Tender Joint count: 0 CDAI Swollen Joint count: 0  Global Assessments:  Patient Global Assessment: 2 Provider Global Assessment: 2  CDAI Calculated Score: 4    Investigation: No additional findings. CBC Latest Ref Rng & Units 02/03/2018 11/26/2017 08/06/2017  WBC 3.8 - 10.8 Thousand/uL 7.2 6.9 7.3  Hemoglobin 11.7 - 15.5 g/dL 41.9 37.9 02.4  Hematocrit 35.0 - 45.0 % 36.5 37.1 36.6  Platelets 140 - 400 Thousand/uL 194 232 210   CMP Latest Ref Rng & Units 02/03/2018 11/26/2017 08/06/2017  Glucose 65 - 99 mg/dL 94 91  88  BUN 7 - 25 mg/dL 12 10 11   Creatinine 0.50 - 1.05 mg/dL 9.40 7.68 0.88  Sodium 135 - 146 mmol/L 139 140 140  Potassium 3.5 - 5.3 mmol/L 4.7 4.8 4.6  Chloride 98 - 110 mmol/L 103 104 104  CO2 20 - 32 mmol/L 30 29 27   Calcium 8.6 - 10.4 mg/dL 9.4 9.3 9.2  Total Protein 6.1 - 8.1 g/dL 7.2 7.4 7.5  Total Bilirubin 0.2 - 1.2 mg/dL 0.4 0.6 0.5  Alkaline Phos 38 - 126 U/L - - -  AST 10 - 35 U/L 82(H) 20 20  ALT 6 - 29 U/L 118(H) 16 15    Imaging: No results found.  Speciality Comments: No specialty comments available.    Procedures:  No procedures performed Allergies: Patient has no known allergies.   Assessment / Plan:     Visit Diagnoses: Rheumatoid arthritis involving multiple sites with positive rheumatoid factor (HCC) - +RF, +CCP, +ANA, +S, +nodules: She has no synovitis on exam today.  She has not had any recent flares of rheumatoid arthritis.   Labs on 02/03/2018 revealed elevated LFTs, so she was informed to discontinue methotrexate.  We are rechecking her LFTs today.  She continues to inject Humira every other week.  She was concerned that Humira was causing increased fatigue and muscle spasms.  We had a detailed discussion about the importance of her staying on Humira.  She was encouraged to try taking magnesium malate 250 mg at bedtime for muscle spasms.  We discussed indications contraindications and side effects.  She has had an upper respiratory infection for 1 month.  She continues to have congestion and a productive cough.  We prescribed a Z-Pak today.  She was advised to hold her Humira dose next week and resume the following week.  She will continue to hold off on methotrexate due to the elevation of LFTs.  High risk medication use - Humira, MTX 8 tabs po q wk , folic acid 2 mg po qd. -CBC, CMP, TB Gold were ordered today.  She will return in July number 3 months to monitor for drug toxicity.  Plan: QuantiFERON-TB Gold Plus, CBC with Differential/Platelet, COMPLETE METABOLIC PANEL WITH GFR  Primary osteoarthritis of both hands: She has no discomfort in her hands at this time.  DDD (degenerative disc disease), cervical: She has good range of motion on exam.  She expresses occasional discomfort in her neck.  DDD (degenerative disc disease), lumbar - s/p discectomy: Chronic pain.  She has no midline spinal tenderness on exam.  Carpal tunnel syndrome on both sides - She has previous carpal tunnel release of her right wrist  Other medical conditions are listed as follows:  Left foot drop  Peroneal neuropathy, unspecified laterality  History of vitamin D deficiency  History of hypothyroidism  History of glaucoma  History of thyroidectomy    Orders: Orders Placed This Encounter  Procedures  . QuantiFERON-TB Gold Plus  . CBC with Differential/Platelet  . COMPLETE METABOLIC PANEL WITH GFR   No orders of the defined types  were placed in this encounter.   Face-to-face time spent with patient was 30 minutes. >50% of time was spent in counseling and coordination of care.  Follow-Up Instructions: Return in about 5 months (around 08/01/2018) for Rheumatoid arthritis, Osteoarthritis.   Gearldine Bienenstock, PA-C   I examined and evaluated the patient with Sherron Ales PA.  Had detailed discussion with the patient today.  She had no synovitis on examination.  She will continue humira for right now.  The plan of care was discussed as noted above.  Pollyann Savoy, MD  Note - This record has been created using Animal nutritionist.  Chart creation errors have been sought, but may not always  have been located. Such creation errors do not reflect on  the standard of medical care.

## 2018-02-28 NOTE — Assessment & Plan Note (Signed)
With ongoing joint and extremity soreness.  She has been told she can't take Ibuprofen or other nsaids while being on humira.  She has appt with Rheum tomorrow.  Plan short-term course tramadol for severe pain.  Tylenol PRN mild-mod pain.

## 2018-02-28 NOTE — Patient Instructions (Signed)
It was good to see you today.  Take the Cetirizine-D to help with your runny nose.  Take 1 pill a day.    You can also try Nasacort to help with this.  Use this two sprays in each nostril daily.  Come back and see [place] in a month to make sure you're doing okay.  Follow-up with your rheumatologist tomorrow.

## 2018-02-28 NOTE — Assessment & Plan Note (Signed)
She is not currently taking any inhaled corticosteroid.  Sounds like she try this for very short period of time and then just stopped. -Nasacort she does not want to try Flonase any longer. -We will also switch to cetirizine-D for short-term course the next couple weeks.  Over that she should go back to cetirizine.  I think this might clear up some of her URI type symptoms. -No evidence of actual upper respiratory infection. Follow-up in next 2-4 weeks to assess for improvement.  Sooner if worsening.

## 2018-02-28 NOTE — Progress Notes (Signed)
Subjective:   Language resources Arabic interpreter Azucena Cecil used for entire visit.  Ashley Bates is a 57 y.o. female who presents to [facility] today for pain and runny nose:  1.  Pain: Patient has diagnosis of rheumatoid arthritis for which she follows with rheumatology.  She was on with some to be several antirheumatic oral medications but recently switched to Humira.  She states that she has had worsening lower extremity pain and cramps since starting that medicine.  Also with some vague upper extremity pain that she describes as dull ache that comes and goes.  No falls.  No injury.  No swelling of joints.  No lower extremity edema.  She does occasionally have leg cramps at night that awakened from sleep and last for a few moments and then resolve spontaneously.  2.  Runny nose and eyes: She has had ongoing issues with what seem to be seasonal allergies for the past month.  She was prescribed cetirizine and Flonase without any relief.  She has a mild headache along with these symptoms.  Symptoms are present when she wakes up in the morning.  Worse when she goes outside.  Thin clear drainage.  No purulent drainage.  No fevers or chills.  Occasional dry cough at night but this is not persistent.  ROS as above per HPI.    The following portions of the patient's history were reviewed and updated as appropriate: allergies, current medications, past medical history, family and social history, and problem list. Patient is a nonsmoker.    PMH reviewed.  Past Medical History:  Diagnosis Date  . Arthritis   . Back pain   . DDD (degenerative disc disease), cervical 10/06/2016  . DDD (degenerative disc disease), lumbar 10/06/2016  . 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 Post-surgical hypothyroidism   . Scoliosis   . Spinal stenosis   . Vitamin D deficiency 10/06/2016   Past Surgical History:  Procedure Laterality Date  . BACK SURGERY     x 3 (last in 2009)  . CARPAL TUNNEL RELEASE      2007  . CATARACT EXTRACTION, BILATERAL Bilateral   . LUMBAR DISC SURGERY    . THYROIDECTOMY     2006    Medications reviewed. Current Outpatient Medications  Medication Sig Dispense Refill  . Adalimumab (HUMIRA PEN) 40 MG/0.4ML PNKT Inject 40 mg into the skin every 14 (fourteen) days. 6 each 0  . benzonatate (TESSALON) 100 MG capsule Take 1 capsule (100 mg total) by mouth every 8 (eight) hours. 21 capsule 0  . butalbital-acetaminophen-caffeine (FIORICET, ESGIC) 50-325-40 MG tablet Take 1-2 tablets by mouth every 6 (six) hours as needed for headache. 20 tablet 0  . cetirizine (ZYRTEC ALLERGY) 10 MG tablet Take 1 tablet (10 mg total) by mouth daily. 30 tablet 0  . fluticasone (FLONASE) 50 MCG/ACT nasal spray Place 1 spray into both nostrils daily. 16 g 0  . folic acid (FOLVITE) 1 MG tablet Take 2 tablets (2 mg total) by mouth daily. 180 tablet 4  . levothyroxine (SYNTHROID, LEVOTHROID) 50 MCG tablet Take 1 tablet (50 mcg total) by mouth daily before breakfast. khudh 1 qurs yawmiaan 30 daqiqat qabl al'iiftar. 30 tablet 1  . methotrexate (RHEUMATREX) 2.5 MG tablet Take 8 tablets (20 mg total) by mouth once a week. Caution:Chemotherapy. Protect from light. 32 tablet 2  . Multiple Vitamins-Minerals (WOMENS MULTIVITAMIN) TABS Take 1 tablet by mouth daily. 30 tablet 11  . ondansetron (ZOFRAN-ODT) 8 MG disintegrating tablet Take  1 tablet (8 mg total) by mouth every 8 (eight) hours as needed for nausea. 30 tablet 0  . traZODone (DESYREL) 50 MG tablet TAKE 1 TABLET (50 MG TOTAL) AT BEDTIME AS NEEDED BY MOUTH FOR SLEEP. 30 tablet 0   No current facility-administered medications for this visit.      Objective:   Physical Exam BP 104/64   Pulse 60   Temp 98.7 F (37.1 C) (Oral)   Ht 5\' 5"  (1.651 m)   Wt 162 lb 9.6 oz (73.8 kg)   SpO2 98%   BMI 27.06 kg/m  Gen:  Patient sitting on exam table, appears stated age in no acute distress Head: Normocephalic atraumatic Eyes: EOMI, PERRL, sclera  and conjunctiva non-erythematous Ears:  Canals clear bilaterally.  TMs pearly gray bilaterally without erythema or bulging.   Nose:  Nasal turbinates boggy with some clear exudates noted bilaterally. Mouth: Mucosa membranes moist. Tonsils +2, nonenlarged, non-erythematous. Neck: No cervical lymphadenopathy noted Heart:  RRR, no murmurs auscultated. Pulm:  Clear to auscultation bilaterally with good air movement.  No wheezes or rales noted.   MSK: No joint effusion or swelling throughout.  Muscles are currently not tender in her arms or legs. Extremity: No lower extremity edema

## 2018-03-01 ENCOUNTER — Ambulatory Visit: Payer: Medicaid Other | Admitting: Rheumatology

## 2018-03-01 ENCOUNTER — Encounter: Payer: Self-pay | Admitting: Rheumatology

## 2018-03-01 VITALS — BP 113/84 | HR 74 | Resp 15 | Ht 64.96 in | Wt 162.0 lb

## 2018-03-01 DIAGNOSIS — M19041 Primary osteoarthritis, right hand: Secondary | ICD-10-CM | POA: Diagnosis not present

## 2018-03-01 DIAGNOSIS — Z79899 Other long term (current) drug therapy: Secondary | ICD-10-CM

## 2018-03-01 DIAGNOSIS — G5603 Carpal tunnel syndrome, bilateral upper limbs: Secondary | ICD-10-CM

## 2018-03-01 DIAGNOSIS — Z8669 Personal history of other diseases of the nervous system and sense organs: Secondary | ICD-10-CM

## 2018-03-01 DIAGNOSIS — G5730 Lesion of lateral popliteal nerve, unspecified lower limb: Secondary | ICD-10-CM | POA: Diagnosis not present

## 2018-03-01 DIAGNOSIS — Z8639 Personal history of other endocrine, nutritional and metabolic disease: Secondary | ICD-10-CM

## 2018-03-01 DIAGNOSIS — Z9889 Other specified postprocedural states: Secondary | ICD-10-CM

## 2018-03-01 DIAGNOSIS — M5030 Other cervical disc degeneration, unspecified cervical region: Secondary | ICD-10-CM

## 2018-03-01 DIAGNOSIS — M0579 Rheumatoid arthritis with rheumatoid factor of multiple sites without organ or systems involvement: Secondary | ICD-10-CM

## 2018-03-01 DIAGNOSIS — M21372 Foot drop, left foot: Secondary | ICD-10-CM

## 2018-03-01 DIAGNOSIS — M5136 Other intervertebral disc degeneration, lumbar region: Secondary | ICD-10-CM

## 2018-03-01 DIAGNOSIS — M19042 Primary osteoarthritis, left hand: Secondary | ICD-10-CM

## 2018-03-01 DIAGNOSIS — M51369 Other intervertebral disc degeneration, lumbar region without mention of lumbar back pain or lower extremity pain: Secondary | ICD-10-CM

## 2018-03-01 DIAGNOSIS — E890 Postprocedural hypothyroidism: Secondary | ICD-10-CM

## 2018-03-01 DIAGNOSIS — Z9009 Acquired absence of other part of head and neck: Secondary | ICD-10-CM

## 2018-03-01 MED ORDER — AZITHROMYCIN 500 MG PO TABS: 500.0000 mg | ORAL_TABLET | Freq: Every day | ORAL | 0 refills | Status: DC

## 2018-03-01 NOTE — Patient Instructions (Signed)
Magnesium Malate 250 mg take 1 tablet by mouth at bedtime    Standing Labs We placed an order today for your standing lab work.    Please come back and get your standing labs in July and every 3 months   We have open lab Monday through Friday from 8:30-11:30 AM and 1:30-4:00 PM  at the office of Dr. Pollyann Savoy.   You may experience shorter wait times on Monday and Friday afternoons. The office is located at [address], [address], [place], [place] [address] No appointment is necessary.   Labs are drawn by [facility].  You may receive a bill from [facility] for your lab work. If you have any questions regarding directions or hours of operation,  please call [phone].

## 2018-03-02 NOTE — Progress Notes (Signed)
CBC stable. CMP WNL. LFTs WNL.

## 2018-03-03 LAB — COMPLETE METABOLIC PANEL WITHOUT GFR
AG Ratio: 1.4 (calc) (ref 1.0–2.5)
ALT: 14 U/L (ref 6–29)
AST: 20 U/L (ref 10–35)
Albumin: 4.4 g/dL (ref 3.6–5.1)
Alkaline phosphatase (APISO): 52 U/L (ref 33–130)
BUN: 13 mg/dL (ref 7–25)
CO2: 29 mmol/L (ref 20–32)
Calcium: 9.3 mg/dL (ref 8.6–10.4)
Chloride: 103 mmol/L (ref 98–110)
Creat: 0.77 mg/dL (ref 0.50–1.05)
GFR, Est African American: 99 mL/min/{1.73_m2}
GFR, Est Non African American: 85 mL/min/{1.73_m2}
Globulin: 3.2 g/dL (ref 1.9–3.7)
Glucose, Bld: 80 mg/dL (ref 65–99)
Potassium: 4.5 mmol/L (ref 3.5–5.3)
Sodium: 139 mmol/L (ref 135–146)
Total Bilirubin: 0.5 mg/dL (ref 0.2–1.2)
Total Protein: 7.6 g/dL (ref 6.1–8.1)

## 2018-03-03 LAB — CBC WITH DIFFERENTIAL/PLATELET
BASOS ABS: 110 {cells}/uL (ref 0–200)
Basophils Relative: 1.6 %
EOS ABS: 104 {cells}/uL (ref 15–500)
Eosinophils Relative: 1.5 %
HEMATOCRIT: 37.9 % (ref 35.0–45.0)
HEMOGLOBIN: 12.8 g/dL (ref 11.7–15.5)
LYMPHS ABS: 3215 {cells}/uL (ref 850–3900)
MCH: 29.8 pg (ref 27.0–33.0)
MCHC: 33.8 g/dL (ref 32.0–36.0)
MCV: 88.1 fL (ref 80.0–100.0)
MPV: 12.9 fL — ABNORMAL HIGH (ref 7.5–12.5)
Monocytes Relative: 11.4 %
NEUTROS ABS: 2684 {cells}/uL (ref 1500–7800)
Neutrophils Relative %: 38.9 %
Platelets: 211 10*3/uL (ref 140–400)
RBC: 4.3 10*6/uL (ref 3.80–5.10)
RDW: 15.3 % — ABNORMAL HIGH (ref 11.0–15.0)
Total Lymphocyte: 46.6 %
WBC mixed population: 787 cells/uL (ref 200–950)
WBC: 6.9 10*3/uL (ref 3.8–10.8)

## 2018-03-03 LAB — QUANTIFERON-TB GOLD PLUS
NIL: 0.11 [IU]/mL
QUANTIFERON-TB GOLD PLUS: NEGATIVE
TB1-NIL: <0 IU/mL
TB2-NIL: 0.02 IU/mL

## 2018-03-03 NOTE — Progress Notes (Signed)
TB gold negative

## 2018-03-30 ENCOUNTER — Other Ambulatory Visit: Payer: Self-pay | Admitting: Rheumatology

## 2018-03-30 ENCOUNTER — Telehealth: Payer: Self-pay

## 2018-03-30 NOTE — Telephone Encounter (Signed)
Received notice from [facility] stating that they have had unsuccessful attempts in reaching the pt to schedule shipment of Humira refill. Called pt to update. Left message.  Kahmya Pinkham, [place], CPhT 9:06 AM

## 2018-03-30 NOTE — Telephone Encounter (Signed)
Last Visit: 03/01/18 Next Visit: 08/05/18 Labs: 03/01/18 CBC stable. CMP WNL. LFTs WNL.  TB Gold: 03/01/18 neg   Okay to refill per Dr. Corliss Skains

## 2018-04-04 MED FILL — HUMIRA PEN 40 MG/0.4ML PNKT: 40 | 28 days supply | Qty: 2 | Fill #0

## 2018-04-14 NOTE — Progress Notes (Signed)
Pt left without being seen. Kodey Xue, Maryjo [place], CMA

## 2018-04-28 MED FILL — HUMIRA PEN 40 MG/0.4ML PNKT: 40 | 28 days supply | Qty: 2 | Fill #1

## 2018-05-10 ENCOUNTER — Other Ambulatory Visit: Payer: Self-pay

## 2018-05-10 ENCOUNTER — Ambulatory Visit: Payer: Medicaid Other | Admitting: Internal Medicine

## 2018-05-10 ENCOUNTER — Encounter: Payer: Self-pay | Admitting: Internal Medicine

## 2018-05-10 VITALS — BP 105/62 | HR 60 | Temp 98.3°F | Wt 164.0 lb

## 2018-05-10 DIAGNOSIS — M62838 Other muscle spasm: Secondary | ICD-10-CM | POA: Diagnosis present

## 2018-05-10 DIAGNOSIS — R413 Other amnesia: Secondary | ICD-10-CM | POA: Diagnosis not present

## 2018-05-10 NOTE — Patient Instructions (Signed)
We will make an appointment for you with Dr. McDiarmid to discuss your memory issues I will get lab work for muscle cramps.

## 2018-05-10 NOTE — Progress Notes (Signed)
   [facility] Phone: [phone]   Date of Visit: 05/10/2018   HPI:  Interpreter used for visit [phone]  Muscle Spasms:  - this is a chronic issue that she has had for at least the past 6 months. She has been seen by providers in the past for this. She reports they have been prescribing her muscle relaxers which do not really help with her symptoms - muscle spasms are usually in her legs and on her right flank - it can occur with rest or with activity - feels like symptoms are worsening - symptoms occur day and last about 5 minutes at a time - cannot think of any aggravating factors - alleviating factors: when this occurs she stands up and walks for a little until it resolves - reports she is taking Folic acid daily, reports she is taking an antidepressant but regularly (trazodone)   Memory difficulty/Difficulty with concentration: - reports of having issues with memory and concentration for years. She feels like this is worsening - for example, she sometimes forgets the stove is on when she is cooking, sometimes she forgets to do certain tasks or to take her medications - she has not been evaluated for this before - she denies symptoms of depression or anhedonia. Reports of anxiety but only about her memory issues.  - she has a chronic history of insomnia where she has difficulty falling asleep or staying asleep.  - denies snoring while sleeping   ROS: See HPI.  [place]:  PMH: Hypothyroidism  RA  Glaucoma   PHYSICAL EXAM: BP 105/62   Pulse 60   Temp 98.3 F (36.8 C) (Oral)   Wt 164 lb (74.4 kg)   SpO2 97%   BMI 27.32 kg/m  GEN: NAD HEENT: Atraumatic, normocephalic, neck supple, EOMI, sclera clear  CV: RRR, no murmurs, rubs, or gallops PULM: CTAB, normal effort MSK: no tenderness to palpation of lower extremities or flank area.  SKIN: No rash or cyanosis; warm and well-perfused EXTR: No lower extremity edema or calf tenderness PSYCH: Mood and  affect euthymic, normal rate and volume of speech NEURO: Awake, alert, no focal deficits grossly, normal speech  ASSESSMENT/PLAN:  1. Muscle spasm Chronic. Unclear in etiology currently although it is likely related to her rheumatoid arthritis. She may also have fibromyalgia. Will obtain the following lab work for further evaluation.  - CK - CMP14+EGFR - TSH - Vitamin B12 - VITAMIN D 25 Hydroxy (Vit-D Deficiency, Fractures)  2. Memory difficulties Patient is very young to have dementia. Patient mainly reported her symptoms. Her husband was with her at the visit but did not contribute to the conversation. She does not appear to have mood disorder contributing to this. Recommend visit with Dr. McDiarmid for more detailed evaluation. I think it would be difficult to do MOCHA with language barrier. Appointment made.   Smiley Houseman, MD PGY [place]

## 2018-05-11 LAB — CMP14+EGFR
A/G RATIO: 1.6 (ref 1.2–2.2)
ALT: 15 IU/L (ref 0–32)
AST: 17 IU/L (ref 0–40)
Albumin: 4.6 g/dL (ref 3.5–5.5)
Alkaline Phosphatase: 67 IU/L (ref 39–117)
BUN / CREAT RATIO: 18 (ref 9–23)
BUN: 14 mg/dL (ref 6–24)
Bilirubin Total: 0.4 mg/dL (ref 0.0–1.2)
CO2: 24 mmol/L (ref 20–29)
Calcium: 9.4 mg/dL (ref 8.7–10.2)
Chloride: 101 mmol/L (ref 96–106)
Creatinine, Ser: 0.78 mg/dL (ref 0.57–1.00)
GFR calc non Af Amer: 84 mL/min/{1.73_m2} (ref >59)
GFR, EST AFRICAN AMERICAN: 97 mL/min/{1.73_m2} (ref >59)
GLOBULIN, TOTAL: 2.9 g/dL (ref 1.5–4.5)
Glucose: 139 mg/dL — ABNORMAL HIGH (ref 65–99)
POTASSIUM: 3.7 mmol/L (ref 3.5–5.2)
SODIUM: 140 mmol/L (ref 134–144)
TOTAL PROTEIN: 7.5 g/dL (ref 6.0–8.5)

## 2018-05-11 LAB — CK: Total CK: 180 U/L — ABNORMAL HIGH (ref 24–173)

## 2018-05-11 LAB — VITAMIN B12: Vitamin B-12: 350 pg/mL (ref 232–1245)

## 2018-05-11 LAB — VITAMIN D 25 HYDROXY (VIT D DEFICIENCY, FRACTURES): Vit D, 25-Hydroxy: 9.9 ng/mL — ABNORMAL LOW (ref 30.0–100.0)

## 2018-05-11 LAB — TSH: TSH: 1.87 u[IU]/mL (ref 0.450–4.500)

## 2018-05-12 ENCOUNTER — Telehealth: Payer: Self-pay | Admitting: Internal Medicine

## 2018-05-12 MED ORDER — ERGOCALCIFEROL 1.25 MG (50000 UT) PO CAPS: 50000.0000 [IU] | ORAL_CAPSULE | ORAL | 0 refills | Status: DC

## 2018-05-12 NOTE — Telephone Encounter (Signed)
#  [id number]  Called patient using interpreter to inform of Vitamin D level. Prescribed Vit D 50,000IU weekly for 8 weeks. Patient to follow up after completion for retesting. We discussed her CK was slightly elevated. This could be due to her being on Humira vs having ?myositis since she does have Rheumatoid arthritis. I sent her Rheumatologist a message regarding this and for recommendations on further evaluation if necessary.   Questions answered.

## 2018-05-26 MED FILL — HUMIRA PEN 40 MG/0.4ML PNKT: 40 | 28 days supply | Qty: 2 | Fill #2

## 2018-06-01 ENCOUNTER — Telehealth: Payer: Self-pay | Admitting: *Deleted

## 2018-06-01 ENCOUNTER — Other Ambulatory Visit: Payer: Self-pay | Admitting: Internal Medicine

## 2018-06-01 NOTE — Telephone Encounter (Signed)
Attempted to call pt, no answer. Will try again later. Manaal Mandala, CMA  

## 2018-06-01 NOTE — Telephone Encounter (Signed)
-----   Message from Arlyce Harman, DO sent at 06/01/2018  9:54 AM EDT ----- Regarding: Vit D refill Patient was started on Vit D 50,000U per week for 8 weeks. Dr. Ottie Glazier sent her a prescription for 4 pills with one refill so I'm thinking the patient is a little confused.  She should not need another prescription for this medication. She should have one refill at the pharmacy and after that we will recheck her Vit D levels. Can we please call the patient and let her know? Thanks!  Tim

## 2018-06-02 NOTE — Telephone Encounter (Signed)
Called patient using [facility] [phone].  Explained to patient that she did not need another RX for vitamin D. Informed her that she could go to the pharmacy to pick up her refill. And would be reevaluated after that.  Patient seemed confused at first and stated that she did not drive nor did she know the location of the pharmacy. Her husband was not home but he is the person who took her to the pharmacy so I just told her to  Ask him to take her again. Patient finally understood after 20 minutes on the phone.   Glennie Hawk, CMA

## 2018-06-09 ENCOUNTER — Ambulatory Visit: Payer: Medicaid Other

## 2018-06-16 ENCOUNTER — Other Ambulatory Visit: Payer: Self-pay

## 2018-06-16 DIAGNOSIS — Z79899 Other long term (current) drug therapy: Secondary | ICD-10-CM

## 2018-06-17 LAB — COMPLETE METABOLIC PANEL WITH GFR
AG Ratio: 1.3 (calc) (ref 1.0–2.5)
ALKALINE PHOSPHATASE (APISO): 61 U/L (ref 33–130)
ALT: 13 U/L (ref 6–29)
AST: 17 U/L (ref 10–35)
Albumin: 4.4 g/dL (ref 3.6–5.1)
BUN: 19 mg/dL (ref 7–25)
CO2: 27 mmol/L (ref 20–32)
CREATININE: 0.68 mg/dL (ref 0.50–1.05)
Calcium: 9.6 mg/dL (ref 8.6–10.4)
Chloride: 103 mmol/L (ref 98–110)
GFR, Est African American: 112 mL/min/{1.73_m2} (ref >=60)
GFR, Est Non African American: 96 mL/min/{1.73_m2} (ref >=60)
GLOBULIN: 3.3 g/dL (ref 1.9–3.7)
GLUCOSE: 83 mg/dL (ref 65–99)
Potassium: 4.9 mmol/L (ref 3.5–5.3)
SODIUM: 140 mmol/L (ref 135–146)
Total Bilirubin: 0.6 mg/dL (ref 0.2–1.2)
Total Protein: 7.7 g/dL (ref 6.1–8.1)

## 2018-06-17 LAB — CBC WITH DIFFERENTIAL/PLATELET
BASOS ABS: 59 {cells}/uL (ref 0–200)
BASOS PCT: 0.9 %
EOS ABS: 117 {cells}/uL (ref 15–500)
Eosinophils Relative: 1.8 %
HCT: 40.7 % (ref 35.0–45.0)
HEMOGLOBIN: 13.6 g/dL (ref 11.7–15.5)
Lymphs Abs: 3107 cells/uL (ref 850–3900)
MCH: 28.8 pg (ref 27.0–33.0)
MCHC: 33.4 g/dL (ref 32.0–36.0)
MCV: 86 fL (ref 80.0–100.0)
MPV: 12.3 fL (ref 7.5–12.5)
Monocytes Relative: 9.6 %
NEUTROS ABS: 2594 {cells}/uL (ref 1500–7800)
Neutrophils Relative %: 39.9 %
Platelets: 182 10*3/uL (ref 140–400)
RBC: 4.73 10*6/uL (ref 3.80–5.10)
RDW: 12.1 % (ref 11.0–15.0)
TOTAL LYMPHOCYTE: 47.8 %
WBC: 6.5 10*3/uL (ref 3.8–10.8)
WBCMIX: 624 {cells}/uL (ref 200–950)

## 2018-06-22 ENCOUNTER — Other Ambulatory Visit: Payer: Self-pay | Admitting: Internal Medicine

## 2018-06-23 ENCOUNTER — Other Ambulatory Visit: Payer: Self-pay | Admitting: Rheumatology

## 2018-06-23 NOTE — Telephone Encounter (Signed)
Last Visit: 03/01/18 Next Visit: 08/05/18 Labs: 06/16/18 WNL  TB Gold: 03/01/18 neg   Okay to refill per Dr. Corliss Skains

## 2018-06-27 MED FILL — HUMIRA PEN 40 MG/0.4ML PNKT: 40 | 28 days supply | Qty: 2 | Fill #0

## 2018-07-08 ENCOUNTER — Telehealth: Payer: Self-pay | Admitting: Rheumatology

## 2018-07-08 NOTE — Telephone Encounter (Signed)
Attempted to contact the patient and left message for patient to call the office.  

## 2018-07-08 NOTE — Telephone Encounter (Signed)
Prescription for Humira was sent to the pharmacy on 06/23/18.   Friend calling for patient. Patient is moving to [facility] and needs to find out about Humira. Patient requesting a new rx to get her thru until finds another Rheumatologist. Patients last shot is due this week.  Patient also would like to know if Dr. Corliss Skains would refer her to a Rheumatologist there? If Dr. Corliss Skains knows of a doctor there, or could recommend. Please call to advise.

## 2018-07-08 NOTE — Telephone Encounter (Signed)
I do not know a rheumatologist in [place].  They will have to find a rheumatologist to online.  She had recent labs in August which were normal.  It would be okay to give Humira prescription for the next 3 months.  We will not be able to refill Humira after that.

## 2018-07-08 NOTE — Telephone Encounter (Signed)
Friend calling for patient. Patient is moving to [facility] and needs to find out about Humira. Patient requesting a new rx to get her thru until finds another Rheumatologist. Patients last shot is due this week.  Patient also would like to know if Dr. Corliss Skains would refer her to a Rheumatologist there? If Dr. Corliss Skains knows of a doctor there, or could recommend. Please call to advise.

## 2018-07-08 NOTE — Telephone Encounter (Signed)
Spoke with [name] and advised patient will have to look online for a Rheumatologist in [place]. Advised prescription was sent to pharmacy on 06/23/18. Patient will have to contact the pharmacy for delivery. Advised we will not be able to refill after this.

## 2018-07-14 MED FILL — HUMIRA PEN 40 MG/0.4ML PNKT: 40 | 28 days supply | Qty: 2 | Fill #1

## 2018-07-16 ENCOUNTER — Encounter (HOSPITAL_COMMUNITY): Payer: Self-pay | Admitting: *Deleted

## 2018-07-16 ENCOUNTER — Ambulatory Visit (HOSPITAL_COMMUNITY)
Admission: EM | Admit: 2018-07-16 | Discharge: 2018-07-16 | Disposition: A | Discharge status: Home / Self Care | Payer: Medicaid Other | Attending: Family Medicine | Admitting: Family Medicine

## 2018-07-16 DIAGNOSIS — Z79899 Other long term (current) drug therapy: Secondary | ICD-10-CM | POA: Insufficient documentation

## 2018-07-16 DIAGNOSIS — R358 Other polyuria: Secondary | ICD-10-CM | POA: Insufficient documentation

## 2018-07-16 DIAGNOSIS — B9689 Other specified bacterial agents as the cause of diseases classified elsewhere: Secondary | ICD-10-CM | POA: Diagnosis not present

## 2018-07-16 DIAGNOSIS — R8281 Pyuria: Secondary | ICD-10-CM

## 2018-07-16 DIAGNOSIS — M069 Rheumatoid arthritis, unspecified: Secondary | ICD-10-CM | POA: Diagnosis not present

## 2018-07-16 DIAGNOSIS — N390 Urinary tract infection, site not specified: Secondary | ICD-10-CM

## 2018-07-16 DIAGNOSIS — E559 Vitamin D deficiency, unspecified: Secondary | ICD-10-CM | POA: Diagnosis not present

## 2018-07-16 DIAGNOSIS — F5101 Primary insomnia: Secondary | ICD-10-CM | POA: Diagnosis not present

## 2018-07-16 DIAGNOSIS — G4700 Insomnia, unspecified: Secondary | ICD-10-CM | POA: Insufficient documentation

## 2018-07-16 DIAGNOSIS — R109 Unspecified abdominal pain: Secondary | ICD-10-CM | POA: Diagnosis not present

## 2018-07-16 DIAGNOSIS — G629 Polyneuropathy, unspecified: Secondary | ICD-10-CM | POA: Insufficient documentation

## 2018-07-16 DIAGNOSIS — H409 Unspecified glaucoma: Secondary | ICD-10-CM | POA: Insufficient documentation

## 2018-07-16 DIAGNOSIS — Z87891 Personal history of nicotine dependence: Secondary | ICD-10-CM | POA: Insufficient documentation

## 2018-07-16 DIAGNOSIS — Z79890 Hormone replacement therapy (postmenopausal): Secondary | ICD-10-CM | POA: Diagnosis not present

## 2018-07-16 DIAGNOSIS — E039 Hypothyroidism, unspecified: Secondary | ICD-10-CM | POA: Insufficient documentation

## 2018-07-16 LAB — POCT URINALYSIS DIP (DEVICE)
Bilirubin Urine: NEGATIVE
GLUCOSE, UA: NEGATIVE mg/dL
Ketones, ur: NEGATIVE mg/dL
NITRITE: NEGATIVE
PROTEIN: NEGATIVE mg/dL
SPECIFIC GRAVITY, URINE: 1.015 (ref 1.005–1.030)
UROBILINOGEN UA: 0.2 mg/dL (ref 0.0–1.0)
pH: 5.5 (ref 5.0–8.0)

## 2018-07-16 MED ORDER — CIPROFLOXACIN HCL 500 MG PO TABS: 500.0000 mg | ORAL_TABLET | Freq: Two times a day (BID) | ORAL | 0 refills | Status: AC

## 2018-07-16 MED ORDER — KETOROLAC TROMETHAMINE 60 MG/2ML IM SOLN
60.0000 mg | Freq: Once | INTRAMUSCULAR | Status: AC
  Administered 2018-07-16: 60 mg via INTRAMUSCULAR

## 2018-07-16 MED ORDER — KETOROLAC TROMETHAMINE 60 MG/2ML IM SOLN
INTRAMUSCULAR | Status: AC
  Filled 2018-07-16: qty 2

## 2018-07-16 NOTE — ED Triage Notes (Signed)
C/O right flank pain x 3 days with polyuria.  Pain worse with movement.

## 2018-07-16 NOTE — Discharge Instructions (Addendum)
For sleep difficulty, try taking melatonin 2 to 5 mg at night.  You can buy this without a prescription.  If you are not improving over the next 24 hours, or if symptoms worsen, go to the emergency department

## 2018-07-16 NOTE — ED Provider Notes (Signed)
[facility]    CSN: [id number] Arrival date & time: 07/16/18  1501     History   Chief Complaint Chief Complaint  Patient presents with  . Flank Pain    HPI Ashley Bates is a 57 y.o. female.   C/O right flank pain x 3 days with polyuria.  Pain worse with movement.  Patient has been able to sleep but when she awakens the pain is there again.  She is tender in the right flank.  She has had chills and fever.  She has had no blood in the urine or dysuria.  She has not vomited.  Patient also complains of insomnia.     Past Medical History:  Diagnosis Date  . Arthritis   . Back pain   . DDD (degenerative disc disease), cervical 10/06/2016  . DDD (degenerative disc disease), lumbar 10/06/2016  . 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 Post-surgical hypothyroidism   . Scoliosis   . Spinal stenosis   . Vitamin D deficiency 10/06/2016    Patient Active Problem List   Diagnosis Date Noted  . Seasonal allergies 02/28/2018  . Cervical cancer screening 06/30/2017  . Hypothyroidism 06/30/2017  . Hospital discharge follow-up 06/01/2017  . Fever   . SIRS (systemic inflammatory response syndrome) (HCC) 05/17/2017  . Healthcare maintenance 04/08/2017  . High risk medication use 10/30/2016  . DDD lumbar spine 10/30/2016  . Neuropathy 10/30/2016  . ANA positive 10/28/2016  . DJD (degenerative joint disease), cervical 10/06/2016  . DDD (degenerative disc disease), lumbar 10/06/2016  . 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 Vitamin D deficiency 10/06/2016  . Pelvic pain in female 02/29/2016  . Elevated TSH 07/16/2015  . Spinal stenosis of lumbar region 03/21/2015  . Left foot drop 03/07/2015  . Lumbago 03/07/2015  . Refugee health examination 03/07/2015  . Glaucoma 03/07/2015  . RA (rheumatoid arthritis) (HCC) 03/04/2015    Past Surgical History:  Procedure Laterality Date  . BACK SURGERY     x 3 (last in 2009)  . CARPAL TUNNEL RELEASE     2007  .  CATARACT EXTRACTION, BILATERAL Bilateral   . LUMBAR DISC SURGERY    . THYROIDECTOMY     2006    OB History   None      Home Medications    Prior to Admission medications   Medication Sig Start Date End Date Taking? Authorizing Provider  HUMIRA PEN 40 MG/0.4ML PNKT INJECT 40 MG INTO THE SKIN EVERY 14 DAYS. 06/23/18  Yes Deveshwar, Janalyn Rouse, MD  levothyroxine (SYNTHROID, LEVOTHROID) 50 MCG tablet Take 1 tablet (50 mcg total) by mouth daily before breakfast. khudh 1 qurs yawmiaan 30 daqiqat qabl al'iiftar. 02/12/18 07/16/18 Yes Alene Mires, NP  methotrexate (RHEUMATREX) 2.5 MG tablet Take 8 tablets (20 mg total) by mouth once a week. Caution:Chemotherapy. Protect from light. 01/06/18  Yes Elvina Sidle, MD  ciprofloxacin (CIPRO) 500 MG tablet Take 1 tablet (500 mg total) by mouth 2 (two) times daily. 07/16/18   Elvina Sidle, MD  traZODone (DESYREL) 50 MG tablet TAKE 1 TABLET (50 MG TOTAL) AT BEDTIME AS NEEDED BY MOUTH FOR SLEEP. 11/18/17   Casey Burkitt, MD  triamcinolone (NASACORT) 55 MCG/ACT AERO nasal inhaler Place 2 sprays into the nose daily. Patient not taking: Reported on 05/10/2018 02/28/18   Tobey Grim, MD  Vitamin D, Ergocalciferol, (DRISDOL) 50000 units CAPS capsule TAKE 1 CAPSULE BY MOUTH ONCE A WEEK FOR 8 DOSES. 06/22/18   Winfrey, Harlen Labs,  MD    Family History Family History  Problem Relation Age of Onset  . Diabetes Neg Hx   . Hyperlipidemia Neg Hx   . Hypertension Neg Hx   . Heart failure Neg Hx     Social History Social History   Tobacco Use  . Smoking status: Former Games developer  . Smokeless tobacco: Never Used  Substance Use Topics  . Alcohol use: No  . Drug use: No     Allergies   Patient has no known allergies.   Review of Systems Review of Systems   Physical Exam Triage Vital Signs ED Triage Vitals [07/16/18 1550]  Enc Vitals Group     BP 131/90     Pulse Rate 83     Resp 18     Temp 97.9 F (36.6 C)     Temp Source  Oral     SpO2 99 %     Weight      Height      Head Circumference      Peak Flow      Pain Score 10     Pain Loc      Pain Edu?      Excl. in GC?    No data found.  Updated Vital Signs BP 131/90   Pulse 83   Temp 97.9 F (36.6 C) (Oral)   Resp 18   SpO2 99%    Physical Exam  Constitutional: She is oriented to person, place, and time. She appears well-developed and well-nourished. She appears distressed.  Patient is restless and moving constantly holding her right flank  HENT:  Right Ear: External ear normal.  Left Ear: External ear normal.  Eyes: Pupils are equal, round, and reactive to light. Conjunctivae are normal.  Neck: Normal range of motion. Neck supple.  Pulmonary/Chest: Effort normal.  Abdominal: There is tenderness. There is guarding.  Tender right flank  Musculoskeletal: Normal range of motion.  Neurological: She is alert and oriented to person, place, and time.  Skin: Skin is warm and dry.  Nursing note and vitals reviewed.    UC Treatments / Results  Labs (all labs ordered are listed, but only abnormal results are displayed) Labs Reviewed  URINE CULTURE  POCT URINALYSIS DIP (DEVICE)    EKG None  Radiology No results found.  Procedures Procedures (including critical care time)  Medications Ordered in UC Medications  ketorolac (TORADOL) injection 60 mg (has no administration in time range)    Initial Impression / Assessment and Plan / UC Course  I have reviewed the triage vital signs and the nursing notes.  Pertinent labs & imaging results that were available during my care of the patient were reviewed by me and considered in my medical decision making (see chart for details).    Final Clinical Impressions(s) / UC Diagnoses   Final diagnoses:  Flank pain  Pyuria  Primary insomnia     Discharge Instructions     For sleep difficulty, try taking melatonin 2 to 5 mg at night.  You can buy this without a prescription.  If you are  not improving over the next 24 hours, or if symptoms worsen, go to the emergency department    ED Prescriptions    Medication Sig Dispense Auth. Provider   ciprofloxacin (CIPRO) 500 MG tablet Take 1 tablet (500 mg total) by mouth 2 (two) times daily. 14 tablet Elvina Sidle, MD     Controlled Substance Prescriptions [place] Controlled Substance Registry consulted? Not Applicable  Elvina Sidle, MD 07/16/18 [phone]

## 2018-07-17 LAB — URINE CULTURE: Culture: <10000 — AB

## 2018-07-22 NOTE — Progress Notes (Deleted)
Office Visit Note  Patient: Ashley Bates             Date of Birth: 04-10-1961           MRN: [id number]             PCP: Arlyce Harman, DO Referring: Erlinda Hong* Visit Date: 08/05/2018 Occupation: @GUAROCC @  Subjective:  No chief complaint on file.   History of Present Illness: Ashley Bates is a 57 y.o. female ***   Activities of Daily Living:  Patient reports morning stiffness for *** {minute/hour:19697}.   Patient {ACTIONS;DENIES/REPORTS:21021675::"Denies"} nocturnal pain.  Difficulty dressing/grooming: {ACTIONS;DENIES/REPORTS:21021675::"Denies"} Difficulty climbing stairs: {ACTIONS;DENIES/REPORTS:21021675::"Denies"} Difficulty getting out of chair: {ACTIONS;DENIES/REPORTS:21021675::"Denies"} Difficulty using hands for taps, buttons, cutlery, and/or writing: {ACTIONS;DENIES/REPORTS:21021675::"Denies"}  No Rheumatology ROS completed.   PMFS History:  Patient Active Problem List   Diagnosis Date Noted  . Seasonal allergies 02/28/2018  . Cervical cancer screening 06/30/2017  . Hypothyroidism 06/30/2017  . Hospital discharge follow-up 06/01/2017  . Fever   . SIRS (systemic inflammatory response syndrome) (HCC) 05/17/2017  . Healthcare maintenance 04/08/2017  . High risk medication use 10/30/2016  . DDD lumbar spine 10/30/2016  . Neuropathy 10/30/2016  . ANA positive 10/28/2016  . DJD (degenerative joint disease), cervical 10/06/2016  . DDD (degenerative disc disease), lumbar 10/06/2016  . 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 Vitamin D deficiency 10/06/2016  . Pelvic pain in female 02/29/2016  . Elevated TSH 07/16/2015  . Spinal stenosis of lumbar region 03/21/2015  . Left foot drop 03/07/2015  . Lumbago 03/07/2015  . Refugee health examination 03/07/2015  . Glaucoma 03/07/2015  . RA (rheumatoid arthritis) (HCC) 03/04/2015    Past Medical History:  Diagnosis Date  . Arthritis   . Back pain   . DDD (degenerative disc disease),  cervical 10/06/2016  . DDD (degenerative disc disease), lumbar 10/06/2016  . 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 Post-surgical hypothyroidism   . Scoliosis   . Spinal stenosis   . Vitamin D deficiency 10/06/2016    Family History  Problem Relation Age of Onset  . Diabetes Neg Hx   . Hyperlipidemia Neg Hx   . Hypertension Neg Hx   . Heart failure Neg Hx    Past Surgical History:  Procedure Laterality Date  . BACK SURGERY     x 3 (last in 2009)  . CARPAL TUNNEL RELEASE     2007  . CATARACT EXTRACTION, BILATERAL Bilateral   . LUMBAR DISC SURGERY    . THYROIDECTOMY     2006   Social History   Social History Narrative   Immigrant Social History:   - Date arrived in 2007: 01/18/14   - Country of origin: [place]   - Location of refugee camp (if applicable), how long there, and what caused patient to leave home country?: No camp, was able to flee country straight to [place]   - Primary language: Arabic   -Requires intepreter (essentially speaks no [place])   - Tobacco/alcohol/drug use: denies   - Marriage Status: Married. With 1 son    Objective: Vital Signs: There were no vitals taken for this visit.   Physical Exam   Musculoskeletal Exam: ***  CDAI Exam: CDAI Score: Not documented Patient Global Assessment: Not documented; Provider Global Assessment: Not documented Swollen: Not documented; Tender: Not documented Joint Exam   Not documented   There is currently no information documented on the homunculus. Go to the Rheumatology activity and complete the homunculus joint exam.  Investigation:  No additional findings.  Imaging: No results found.  Recent Labs: Lab Results  Component Value Date   WBC 6.5 06/16/2018   HGB 13.6 06/16/2018   PLT 182 06/16/2018   NA 140 06/16/2018   K 4.9 06/16/2018   CL 103 06/16/2018   CO2 27 06/16/2018   GLUCOSE 83 06/16/2018   BUN 19 06/16/2018   CREATININE 0.68 06/16/2018   BILITOT 0.6 06/16/2018   ALKPHOS 67  05/10/2018   AST 17 06/16/2018   ALT 13 06/16/2018   PROT 7.7 06/16/2018   ALBUMIN 4.6 05/10/2018   CALCIUM 9.6 06/16/2018   GFRAA 112 06/16/2018   QFTBGOLDPLUS NEGATIVE 03/01/2018    Speciality Comments: No specialty comments available.  Procedures:  No procedures performed Allergies: Patient has no known allergies.   Assessment / Plan:     Visit Diagnoses: No diagnosis found.   Orders: No orders of the defined types were placed in this encounter.  No orders of the defined types were placed in this encounter.   Face-to-face time spent with patient was *** minutes. Greater than 50% of time was spent in counseling and coordination of care.  Follow-Up Instructions: No follow-ups on file.   Ellen Henri, CMA  Note - This record has been created using Animal nutritionist.  Chart creation errors have been sought, but may not always  have been located. Such creation errors do not reflect on  the standard of medical care.

## 2018-08-05 ENCOUNTER — Ambulatory Visit: Payer: Medicaid Other | Admitting: Rheumatology

## 2018-08-11 ENCOUNTER — Telehealth: Payer: Self-pay | Admitting: Rheumatology

## 2018-08-11 NOTE — Telephone Encounter (Signed)
Spoke with [name] and advised that we sent in a prescription on 06/23/18 for a 90 day supply. Reminded [name] that since patient has moved to [place] we will no longer be able to to prescribe her the medication.  [name] states the patient was only sent a 30 day supply at her last refill. [name] was provided with pharmacy number and contact per son, Belenda Cruise. She will reach out to them to set up shipment of the rest of her medication.

## 2018-08-11 NOTE — Telephone Encounter (Signed)
Ashley Bates called stating that Ashley Bates has moved to [place] and is out of Humira.  Ashley Bates is checking to see if there is anyway the medication could be sent to her.  Ashley Bates stated that she is in a meeting this morning and requested a return call after 12:30 pm to discuss options.

## 2018-08-16 ENCOUNTER — Telehealth: Payer: Self-pay | Admitting: *Deleted

## 2018-08-16 NOTE — Telephone Encounter (Signed)
[name] called regarding patient's Humira. Patient is able to get the prescription transferred to a pharmacy in [place] but will be unable to fill the prescription until November when her Medicaid is active. Patient has MTX on hand from where she was taking before. Patient wants to know if she can take this until she can get the Humira filled. Patient advised she can not since we have taken her off due to elevated liver functions. [name] verbalized understanding and will advise patient.

## 2018-08-29 ENCOUNTER — Other Ambulatory Visit: Payer: Self-pay | Admitting: Family Medicine

## 2018-08-30 ENCOUNTER — Other Ambulatory Visit: Payer: Self-pay | Admitting: Family Medicine

## 2018-08-30 MED ORDER — VITAMIN D (ERGOCALCIFEROL) 1.25 MG (50000 UNIT) PO CAPS: ORAL_CAPSULE | ORAL | 1 refills | Status: AC

## 2018-08-30 NOTE — Progress Notes (Signed)
Patient requested prescription be resent to pharmacy
